# Patient Record
Sex: Male | Born: 1990 | Race: White | Hispanic: No | Marital: Married | Smoking: Never smoker
Health system: Southern US, Community
[De-identification: ages and names within clinical notes are randomized; demographics above are authoritative.]

## PROBLEM LIST (undated history)

## (undated) DIAGNOSIS — G4730 Sleep apnea, unspecified: Secondary | ICD-10-CM

## (undated) DIAGNOSIS — B019 Varicella without complication: Secondary | ICD-10-CM

## (undated) HISTORY — DX: Sleep apnea, unspecified: G47.30

## (undated) HISTORY — PX: NO PAST SURGERIES: SHX2092

## (undated) HISTORY — DX: Varicella without complication: B01.9

---
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
## 2010-06-11 LAB — CBC
HCT: 43.1 % (ref 39.0–52.0)
MCH: 29.7 pg (ref 26.0–34.0)
RBC: 4.82 MIL/uL (ref 4.22–5.81)

## 2010-06-11 LAB — DIFFERENTIAL
Basophils Absolute: 0 10*3/uL (ref 0.0–0.1)
Basophils Relative: 0 % (ref 0–1)
Eosinophils Absolute: 0 10*3/uL (ref 0.0–0.7)
Lymphocytes Relative: 8 % — ABNORMAL LOW (ref 12–46)
Monocytes Relative: 8 % (ref 3–12)
Neutro Abs: 13.8 10*3/uL — ABNORMAL HIGH (ref 1.7–7.7)
Neutrophils Relative %: 84 % — ABNORMAL HIGH (ref 43–77)

## 2010-06-11 LAB — POCT INFECTIOUS MONO SCREEN: Mono Screen: NEGATIVE

## 2010-06-11 LAB — POCT RAPID STREP A (OFFICE): Streptococcus, Group A Screen (Direct): NEGATIVE

## 2011-05-31 ENCOUNTER — Emergency Department (HOSPITAL_COMMUNITY)
Admission: EM | Admit: 2011-05-31 | Discharge: 2011-05-31 | Disposition: A | Discharge status: Home / Self Care | Payer: 59 | Source: Home / Self Care | Attending: Family Medicine | Admitting: Family Medicine

## 2011-05-31 ENCOUNTER — Encounter (HOSPITAL_COMMUNITY): Payer: Self-pay | Admitting: *Deleted

## 2011-05-31 DIAGNOSIS — K219 Gastro-esophageal reflux disease without esophagitis: Secondary | ICD-10-CM

## 2011-05-31 MED ORDER — GI COCKTAIL ~~LOC~~
30.0000 mL | Freq: Once | ORAL | Status: AC
  Administered 2011-05-31: 30 mL via ORAL

## 2011-05-31 MED ORDER — GI COCKTAIL ~~LOC~~
ORAL | Status: AC
  Filled 2011-05-31: qty 30

## 2011-05-31 MED ORDER — OMEPRAZOLE 20 MG PO CPDR: 20.0000 mg | DELAYED_RELEASE_CAPSULE | Freq: Every day | ORAL | Status: DC

## 2011-05-31 NOTE — Discharge Instructions (Signed)
Use medicine as prescribed and see specialist if further problems. °

## 2011-05-31 NOTE — ED Provider Notes (Signed)
History     CSN: [id number]  Arrival date & time 05/31/11  1243   First MD Initiated Contact with Patient 05/31/11 1359      Chief Complaint  Patient presents with  . Gastrophageal Reflux    (Consider location/radiation/quality/duration/timing/severity/associated sxs/prior treatment) Patient is a 21 y.o. male presenting with abdominal pain.  Abdominal Pain The primary symptoms of the illness include abdominal pain, nausea and diarrhea. The primary symptoms of the illness do not include vomiting. The current episode started more than 2 days ago.  The patient has had a change in bowel habit (diarrhea from too much pepto, according to pt). Additional symptoms associated with the illness include heartburn and back pain. Significant associated medical issues include GERD.    No past medical history on file.  No past surgical history on file.  No family history on file.  History  Substance Use Topics  . Smoking status: Not on file  . Smokeless tobacco: Not on file  . Alcohol Use: Not on file      Review of Systems  Constitutional: Negative.   Gastrointestinal: Positive for heartburn, nausea, abdominal pain and diarrhea. Negative for vomiting and blood in stool.  Musculoskeletal: Positive for back pain.  Skin: Negative.     Allergies  Review of patient's allergies indicates no known allergies.  Home Medications   Current Outpatient Rx  Name Route Sig Dispense Refill  . OMEPRAZOLE 20 MG PO CPDR Oral Take 1 capsule (20 mg total) by mouth daily. 42 capsule 9    BP 146/90  Pulse 110  Temp(Src) 99.2 F (37.3 C) (Oral)  Resp 20  SpO2 100%  Physical Exam  Nursing note and vitals reviewed. Constitutional: He is oriented to person, place, and time. He appears well-developed and well-nourished.  HENT:  Head: Normocephalic.  Neck: Normal range of motion. Neck supple.  Cardiovascular: Normal rate and normal heart sounds.   Pulmonary/Chest: Breath sounds normal.    Abdominal: Soft. Bowel sounds are normal. He exhibits no mass. There is tenderness. There is no rebound and no guarding.  Lymphadenopathy:    He has no cervical adenopathy.  Neurological: He is alert and oriented to person, place, and time.  Skin: Skin is warm and dry.    ED Course  Procedures (including critical care time)  Labs Reviewed - No data to display No results found.   1. GERD (gastroesophageal reflux disease)       MDM          Barkley Bruns, MD 05/31/11 1534

## 2011-05-31 NOTE — ED Notes (Signed)
3rd day of epigastric pain right after eating.

## 2011-06-01 ENCOUNTER — Ambulatory Visit: Payer: Self-pay | Admitting: General Practice

## 2011-06-01 LAB — COMPREHENSIVE METABOLIC PANEL
Anion Gap: 14 (ref 7–16)
BUN: 14 mg/dL (ref 7–18)
Calcium, Total: 9.1 mg/dL (ref 8.5–10.1)
Chloride: 100 mmol/L (ref 98–107)
EGFR (African American): >60
Osmolality: 277 (ref 275–301)
Potassium: 3.9 mmol/L (ref 3.5–5.1)
SGOT(AST): 21 U/L (ref 15–37)
Total Protein: 8.3 g/dL — ABNORMAL HIGH (ref 6.4–8.2)

## 2011-06-01 LAB — CBC WITH DIFFERENTIAL/PLATELET
Basophil #: 0 10*3/uL (ref 0.0–0.1)
Basophil %: 0.4 %
Eosinophil #: 0 10*3/uL (ref 0.0–0.7)
HCT: 45 % (ref 40.0–52.0)
HGB: 15.4 g/dL (ref 13.0–18.0)
Lymphocyte #: 1.3 10*3/uL (ref 1.0–3.6)
MCHC: 34.3 g/dL (ref 32.0–36.0)
MCV: 88 fL (ref 80–100)
Neutrophil #: 5.4 10*3/uL (ref 1.4–6.5)
RDW: 12.9 % (ref 11.5–14.5)

## 2012-08-11 IMAGING — US ABDOMEN ULTRASOUND
1 series · 17 of 25 positions shown · non-contrast
Comparison: none

REASON FOR EXAM: STAT CR before 5p [phone] after 5pm [id number] [address]
[place] Cell Phone R...
COMMENTS:

[Series 1: abdomen ultrasound · 17 of 78 slices shown]
[im 1/78]
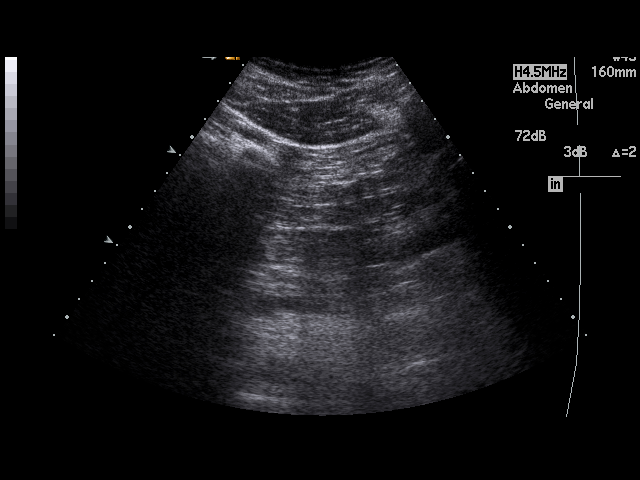
[im 7/78]
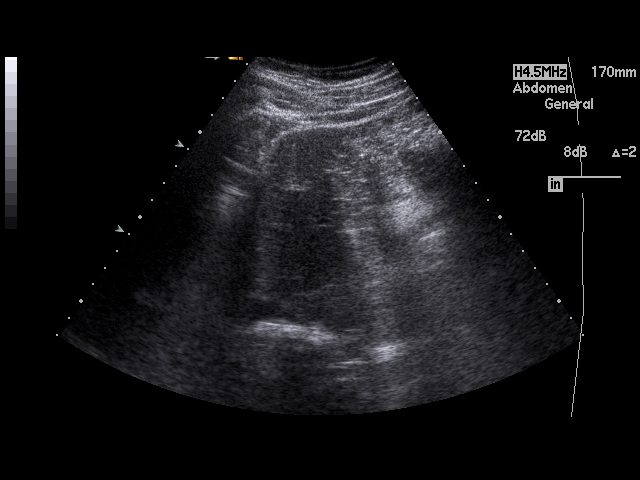
[im 10/78]
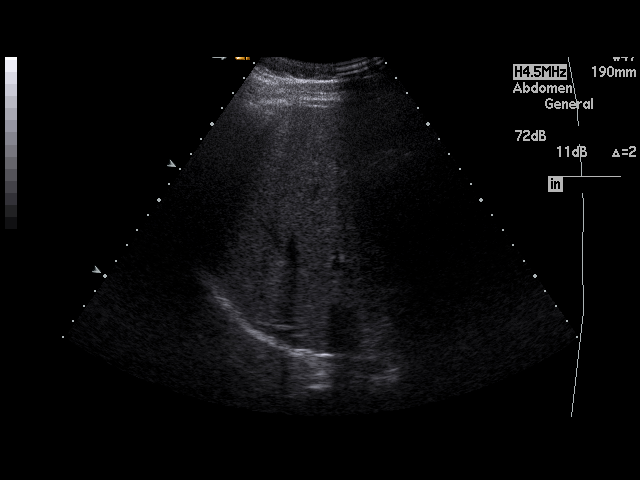
[im 17/78]
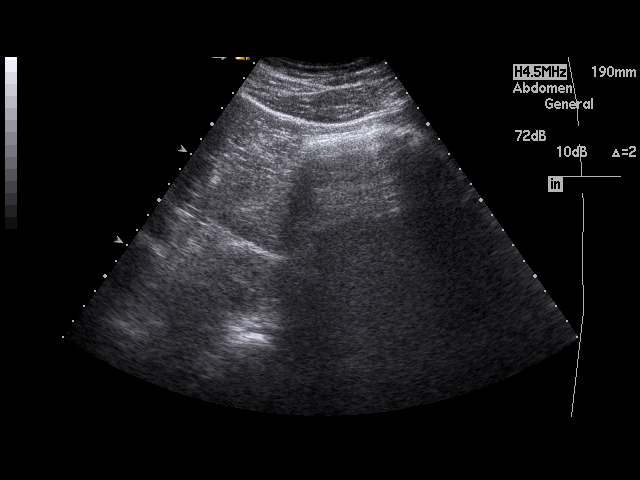
[im 20/78]
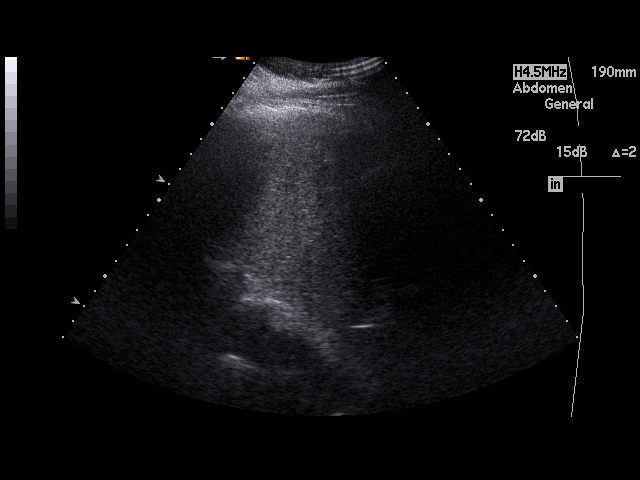
[im 26/78]
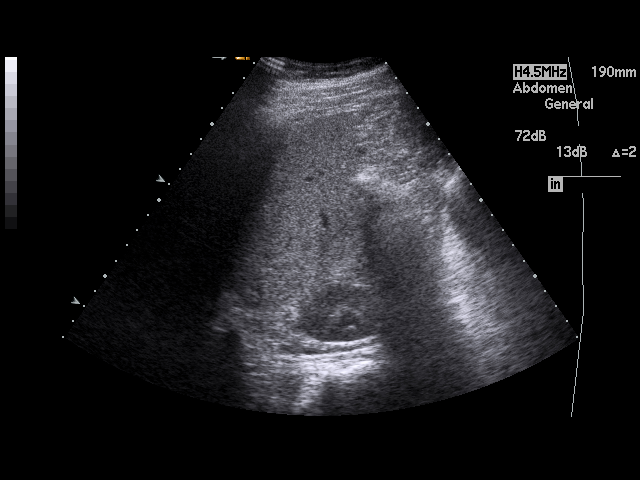
[im 29/78]
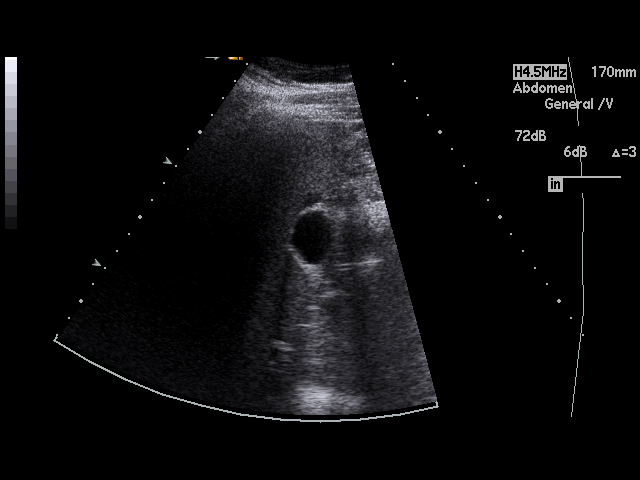
[im 36/78]
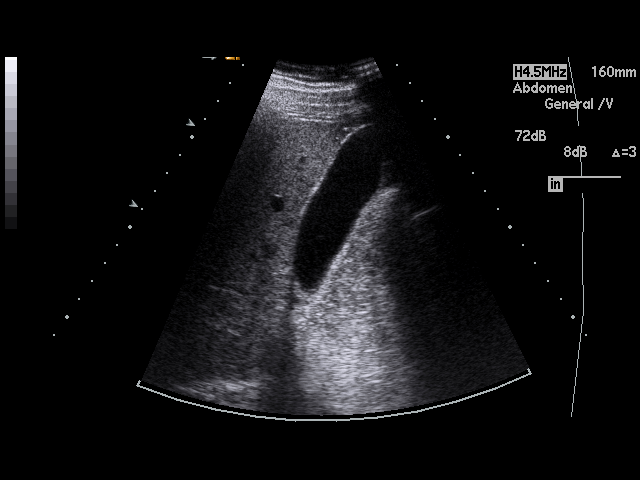
[im 39/78]
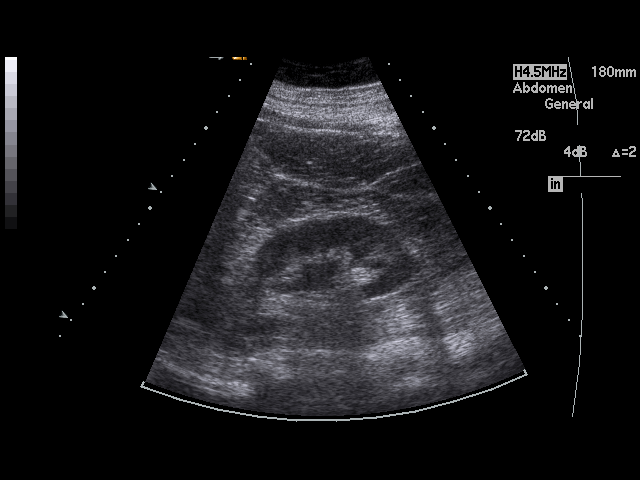
[im 42/78]
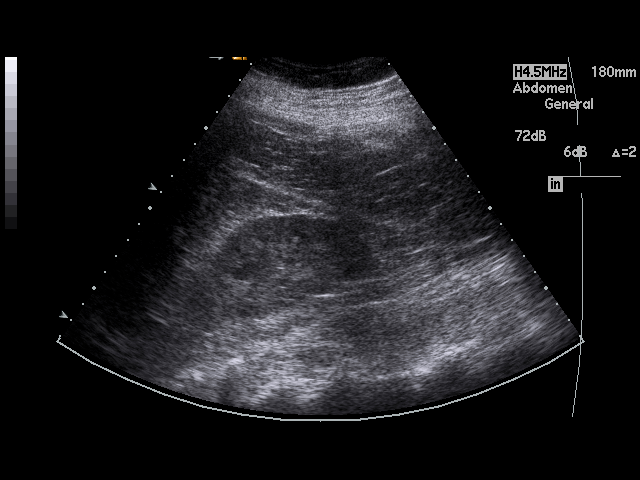
[im 49/78]
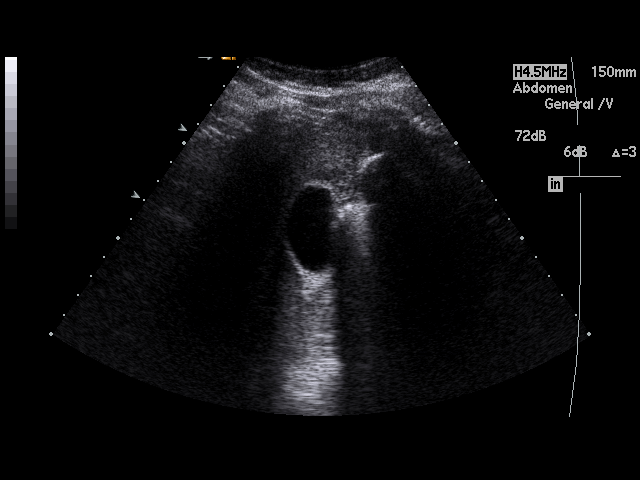
[im 52/78]
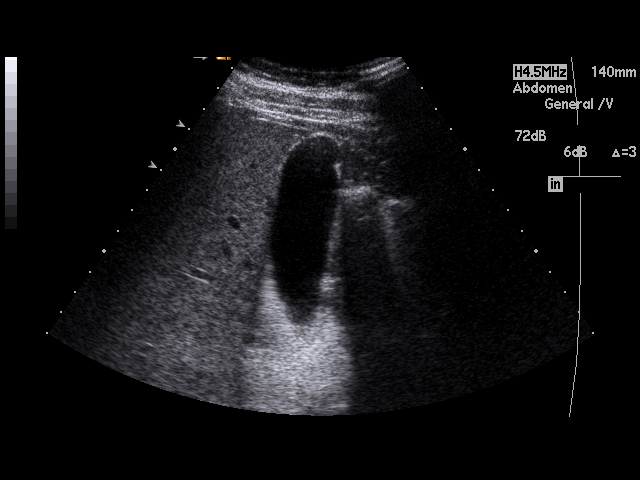
[im 58/78]
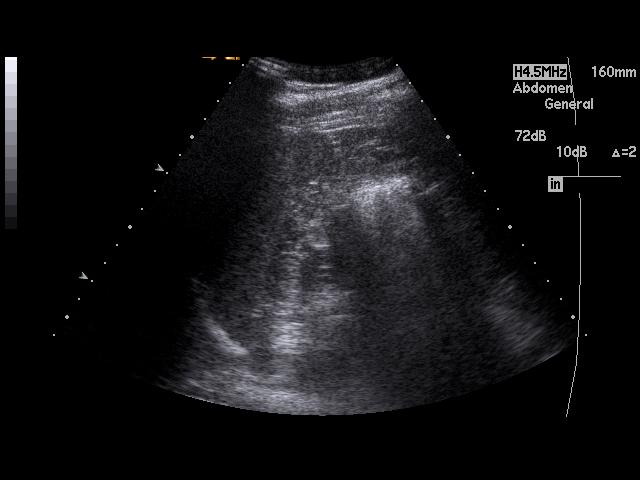
[im 61/78]
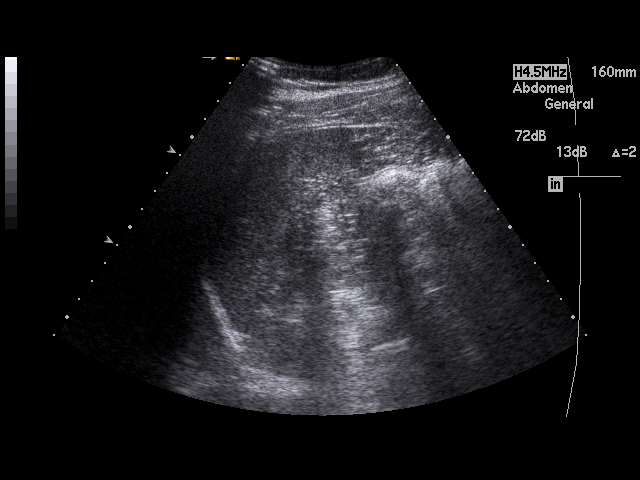
[im 68/78]
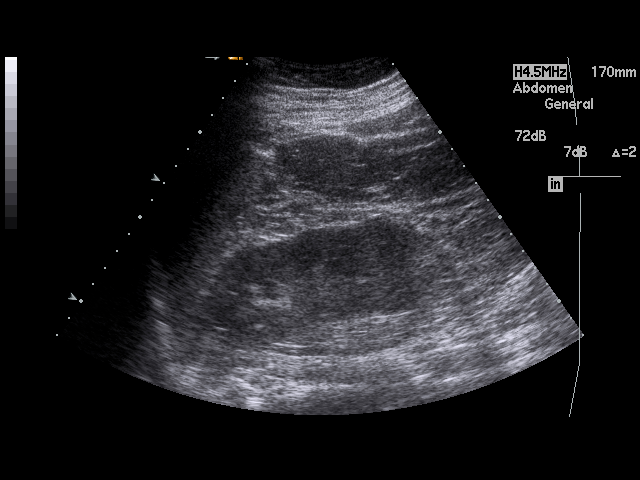
[im 71/78]
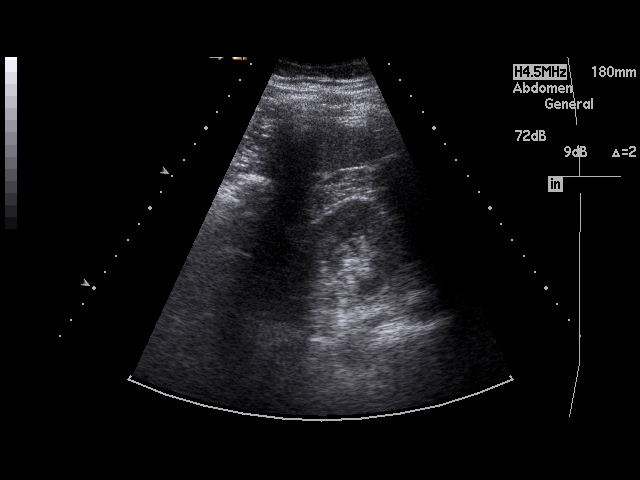
[im 78/78]
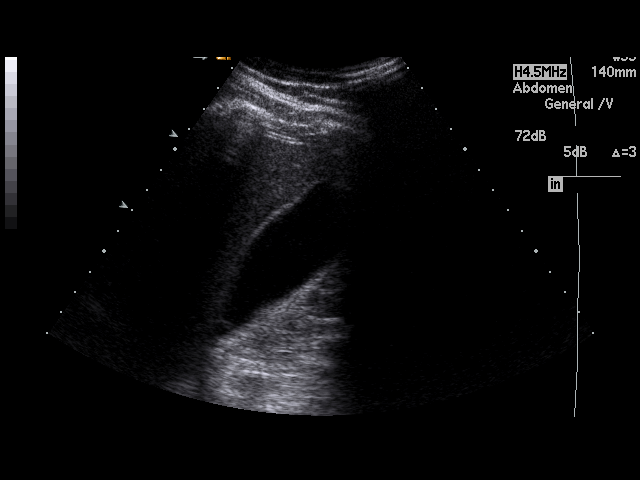

[17 of 25 positions shown; findings below may reference images not displayed]

PROCEDURE:     US  - US ABDOMEN GENERAL SURVEY  - June 01, 2011  [DATE]

RESULT:     Sonographic evaluation of the abdomen shows limited
visualization of the pancreas with no focal acute abnormality. Partial
visualization of the aorta shows no evidence of aneurysm. The gallbladder is
normal without stones or sonographic Murphy's sign. The wall thickness is
2.4 mm. Common bile duct diameter is 1.1 mm. The inferior vena cava, spleen
and kidneys appear normal. The liver shows increased echogenicity diffusely
without focal mass. No intrahepatic biliary ductal dilation is evident. The
portal venous flow is normal.
IMPRESSION: 1. Nonvisualization of portions of the pancreas and aorta. Normal-appearing
abdominal sonogram otherwise.

## 2012-08-15 ENCOUNTER — Ambulatory Visit: Payer: Self-pay | Admitting: Physician Assistant

## 2013-11-10 ENCOUNTER — Ambulatory Visit (INDEPENDENT_AMBULATORY_CARE_PROVIDER_SITE_OTHER): Payer: BLUE CROSS/BLUE SHIELD | Admitting: Family Medicine

## 2013-11-10 VITALS — BP 134/76 | HR 105 | Temp 98.6°F | Resp 15 | Ht 69.0 in | Wt 250.6 lb

## 2013-11-10 DIAGNOSIS — J029 Acute pharyngitis, unspecified: Secondary | ICD-10-CM

## 2013-11-10 LAB — POCT RAPID STREP A (OFFICE): RAPID STREP A SCREEN: NEGATIVE

## 2013-11-10 MED ORDER — HYDROCODONE-ACETAMINOPHEN 7.5-325 MG/15ML PO SOLN: 10.0000 mL | Freq: Four times a day (QID) | ORAL | Status: DC | PRN

## 2013-11-10 MED ORDER — MAGIC MOUTHWASH W/LIDOCAINE: 10.0000 mL | ORAL | Status: DC | PRN

## 2013-11-10 NOTE — Patient Instructions (Signed)

## 2013-11-10 NOTE — Progress Notes (Signed)
Subjective:    Patient ID: Clarence Simmons, male    DOB: 06/28/1990, 23 y.o.   MRNAzzie Simmons: [id number]  Sore Throat  Associated symptoms include coughing and headaches. Pertinent negatives include no ear pain.  Fever  Associated symptoms include coughing, headaches, nausea and a sore throat. Pertinent negatives include no ear pain.   Chief Complaint  Patient presents with  . Sore Throat    since last night  . Fever   This chart was scribed for Norberto SorensonEva Hendy Brindle, MD by Andrew Auaven Small, ED Scribe. This patient was seen in room 3 and the patient's care was started at 10:51 AM.  HPI Comments: Clarence RoupMichael L Simmons is a 23 y.o. male who presents to the [facility] complaining of sudden sore throat and fever onset 1 day. Pt reports associated nausea, cough and HA. Pt reports he was unable to sleep last night due to symptoms. Pt has tried tylenol multi symptom cold with relief and mucinex. Pt reports sick contact of his wife who is also being seen. He reports wife did not have strep. Pt denies hx of mono. Pt denies allergies.   History reviewed. No pertinent past medical history. History reviewed. No pertinent past surgical history. Prior to Admission medications   Medication Sig Start Date End Date Taking? Authorizing Provider  guaiFENesin (MUCINEX) 600 MG 12 hr tablet Take by mouth 2 (two) times daily.   Yes Historical Provider, MD  Phenyleph-CPM-DM-APAP (TYLENOL COLD MULTI-SYMPTOM PO) Take by mouth.   Yes Historical Provider, MD  omeprazole (PRILOSEC) 20 MG capsule Take 1 capsule (20 mg total) by mouth daily. 05/31/11 05/30/12  Linna HoffJames D Kindl, MD   Review of Systems  Constitutional: Positive for fever and chills.  HENT: Positive for sore throat. Negative for ear pain and rhinorrhea.   Respiratory: Positive for cough.   Gastrointestinal: Positive for nausea.  Neurological: Positive for headaches.   Objective:   Physical Exam  Nursing note and vitals reviewed. Constitutional: He is  oriented to person, place, and time. He appears well-developed and well-nourished. No distress.  HENT:  Head: Normocephalic and atraumatic.  Right Ear: Hearing, tympanic membrane, external ear and ear canal normal.  Left Ear: Hearing, tympanic membrane, external ear and ear canal normal.  Nose: Mucosal edema and rhinorrhea present.  Mouth/Throat: Posterior oropharyngeal edema and posterior oropharyngeal erythema present.  Mucosal erythema. Tonsil 3+  Eyes: Conjunctivae and EOM are normal.  Neck: Neck supple.  Cardiovascular: Regular rhythm, S1 normal, S2 normal and normal heart sounds.  Tachycardia present.   Pulmonary/Chest: Effort normal and breath sounds normal. No respiratory distress. He has no wheezes. He has no rales. He exhibits no tenderness.  Musculoskeletal: Normal range of motion.  Lymphadenopathy:       Head (right side): Tonsillar adenopathy present.       Head (left side): Tonsillar adenopathy present.    He has no cervical adenopathy.       Right cervical: No superficial cervical and no posterior cervical adenopathy present.      Left cervical: No superficial cervical and no posterior cervical adenopathy present.       Right: No supraclavicular adenopathy present.       Left: No supraclavicular adenopathy present.  Neurological: He is alert and oriented to person, place, and time.  Skin: Skin is warm and dry.  Psychiatric: He has a normal mood and affect. His behavior is normal.   Filed Vitals:   11/10/13 0944  BP: 134/76  Pulse: 105  Temp: 98.6 F (37 C)  Resp: 15   Results for orders placed in visit on 11/10/13  POCT RAPID STREP A (OFFICE)      Result Value Ref Range   Rapid Strep A Screen Negative  Negative   Assessment & Plan:   Acute pharyngitis, unspecified pharyngitis type - Plan: POCT rapid strep A, Throat culture Loney Loh)  Meds ordered this encounter  Medications  . Phenyleph-CPM-DM-APAP (TYLENOL COLD MULTI-SYMPTOM PO)    Sig: Take by mouth.  Marland Kitchen  guaiFENesin (MUCINEX) 600 MG 12 hr tablet    Sig: Take by mouth 2 (two) times daily.  Marland Kitchen HYDROcodone-acetaminophen (HYCET) 7.5-325 mg/15 ml solution    Sig: Take 10-15 mLs by mouth every 6 (six) hours as needed.    Dispense:  140 mL    Refill:  0  . Alum & Mag Hydroxide-Simeth (MAGIC MOUTHWASH W/LIDOCAINE) SOLN    Sig: Take 10 mLs by mouth every 2 (two) hours as needed for mouth pain.    Dispense:  360 mL    Refill:  0    Ok to use pharmacy formulary. Mix 1:1 ratio with viscous lidocaine    I personally performed the services described in this documentation, which was scribed in my presence. The recorded information has been reviewed and considered, and addended by me as needed.  Norberto Sorenson, MD MPH

## 2013-11-12 LAB — CULTURE, GROUP A STREP: ORGANISM ID, BACTERIA: NORMAL

## 2015-05-06 ENCOUNTER — Ambulatory Visit (INDEPENDENT_AMBULATORY_CARE_PROVIDER_SITE_OTHER): Payer: Commercial Indemnity | Admitting: Family Medicine

## 2015-05-06 ENCOUNTER — Encounter: Payer: Self-pay | Admitting: Family Medicine

## 2015-05-06 VITALS — BP 116/78 | HR 91 | Temp 98.5°F | Ht 70.0 in | Wt 233.1 lb

## 2015-05-06 DIAGNOSIS — Z0000 Encounter for general adult medical examination without abnormal findings: Secondary | ICD-10-CM | POA: Diagnosis not present

## 2015-05-06 DIAGNOSIS — G4733 Obstructive sleep apnea (adult) (pediatric): Secondary | ICD-10-CM | POA: Insufficient documentation

## 2015-05-06 DIAGNOSIS — E669 Obesity, unspecified: Secondary | ICD-10-CM | POA: Insufficient documentation

## 2015-05-06 HISTORY — DX: Obesity, unspecified: E66.9

## 2015-05-06 HISTORY — DX: Encounter for general adult medical examination without abnormal findings: Z00.00

## 2015-05-06 HISTORY — DX: Obstructive sleep apnea (adult) (pediatric): G47.33

## 2015-05-06 NOTE — Assessment & Plan Note (Signed)
Has lost 25 lbs in the last year. Continuing to exercise regularly and is planning to get down to around 200 lbs.

## 2015-05-06 NOTE — Progress Notes (Signed)
Subjective:  Patient ID: Clarence Simmons, male    DOB: 1990-06-04  Age: 25 y.o. MRN: [id number]  CC: Establish care  HPI Clarence Simmons is a 25 y.o. male presents to the clinic today to establish care.  No current issues/complaints.  Preventative Healthcare  Immunizations  Tetanus - Up to date; was given last year.  Pneumococcal - Not indicated.   Flu - 01/10/15.  Labs: Discuss screening labs today. Patient declines.  Exercise: Yes.   Alcohol use: Occasional.   Smoking/tobacco use: No.   STD/HIV testing: Patient married and monogamous. Not indicated.  Regular dental exams: No.   PMH, Surgical Hx, Family Hx, Social History reviewed and updated as below.  Past Medical History  Diagnosis Date  . Chicken pox   . Sleep apnea    Past Surgical History  Procedure Laterality Date  . No past surgeries     Family History  Problem Relation Age of Onset  . Alcohol abuse Mother   . Skin cancer Maternal Grandmother    Social History  Substance Use Topics  . Smoking status: Never Smoker   . Smokeless tobacco: Never Used  . Alcohol Use: 0.0 - 0.6 oz/week    0-1 Standard drinks or equivalent per week   Review of Systems  Neurological: Positive for headaches.  All other systems reviewed and are negative.  Objective:   Today's Vitals: BP 116/78 mmHg  Pulse 91  Temp(Src) 98.5 F (36.9 C) (Oral)  Ht  (1.778 m)  Wt 233 lb 2 oz (105.745 kg)  BMI 33.45 kg/m2  SpO2 96%  Physical Exam  Constitutional: He is oriented to person, place, and time. He appears well-developed and well-nourished. No distress.  HENT:  Head: Normocephalic and atraumatic.  Nose: Nose normal.  Normal TM's bilaterally.  Enlarged tonsils noted.   Eyes: Conjunctivae are normal. No scleral icterus.  Neck: Neck supple. No thyromegaly present.  Cardiovascular: Normal rate and regular rhythm.   No murmur heard. Pulmonary/Chest: Effort normal and breath sounds normal. He has no wheezes.  He has no rales.  Abdominal: Soft. He exhibits no distension. There is no tenderness. There is no rebound and no guarding.  Musculoskeletal: Normal range of motion. He exhibits no edema.  Lymphadenopathy:    He has no cervical adenopathy.  Neurological: He is alert and oriented to person, place, and time.  Skin: Skin is warm and dry. No rash noted.  Psychiatric: He has a normal mood and affect.  Vitals reviewed.  Assessment & Plan:   Problem List Items Addressed This Visit    Obesity (BMI 30.0-34.9)    Has lost 25 lbs in the last year. Continuing to exercise regularly and is planning to get down to around 200 lbs.      OSA (obstructive sleep apnea)    Doing well on CPAP.      Preventative health care - Primary    Tdap and flu up-to-date. Declined screening lab work.         Outpatient Encounter Prescriptions as of 05/06/2015  Medication Sig  . [DISCONTINUED] Alum & Mag Hydroxide-Simeth (MAGIC MOUTHWASH W/LIDOCAINE) SOLN Take 10 mLs by mouth every 2 (two) hours as needed for mouth pain.  . [DISCONTINUED] guaiFENesin (MUCINEX) 600 MG 12 hr tablet Take by mouth 2 (two) times daily.  . [DISCONTINUED] HYDROcodone-acetaminophen (HYCET) 7.5-325 mg/15 ml solution Take 10-15 mLs by mouth every 6 (six) hours as needed.  . [DISCONTINUED] omeprazole (PRILOSEC) 20 MG capsule Take 1 capsule (20  mg total) by mouth daily.  . [DISCONTINUED] Phenyleph-CPM-DM-APAP (TYLENOL COLD MULTI-SYMPTOM PO) Take by mouth.   No facility-administered encounter medications on file as of 05/06/2015.    Follow-up: Return in about 1 year (around 05/05/2016).  Everlene Other DO [facility]

## 2015-05-06 NOTE — Assessment & Plan Note (Signed)
Doing well on CPAP 

## 2015-05-06 NOTE — Progress Notes (Signed)
Pre visit review using our clinic review tool, if applicable. No additional management support is needed unless otherwise documented below in the visit note. 

## 2015-05-06 NOTE — Assessment & Plan Note (Signed)
Tdap and flu up-to-date. Declined screening lab work.

## 2015-05-06 NOTE — Patient Instructions (Signed)
It was nice to see you today.  Continue using your CPAP.  Follow up:  Annually or sooner if needed.  Take care  Dr. Adriana Simas  Health Maintenance, Male A healthy lifestyle and preventative care can promote health and wellness.  Maintain regular health, dental, and eye exams.  Eat a healthy diet. Foods like vegetables, fruits, whole grains, low-fat dairy products, and lean protein foods contain the nutrients you need and are low in calories. Decrease your intake of foods high in solid fats, added sugars, and salt. Get information about a proper diet from your health care provider, if necessary.  Regular physical exercise is one of the most important things you can do for your health. Most adults should get at least 150 minutes of moderate-intensity exercise (any activity that increases your heart rate and causes you to sweat) each week. In addition, most adults need muscle-strengthening exercises on 2 or more days a week.   Maintain a healthy weight. The body mass index (BMI) is a screening tool to identify possible weight problems. It provides an estimate of body fat based on height and weight. Your health care provider can find your BMI and can help you achieve or maintain a healthy weight. For males 20 years and older:  A BMI below 18.5 is considered underweight.  A BMI of 18.5 to 24.9 is normal.  A BMI of 25 to 29.9 is considered overweight.  A BMI of 30 and above is considered obese.  Maintain normal blood lipids and cholesterol by exercising and minimizing your intake of saturated fat. Eat a balanced diet with plenty of fruits and vegetables. Blood tests for lipids and cholesterol should begin at age 59 and be repeated every 5 years. If your lipid or cholesterol levels are high, you are over age 60, or you are at high risk for heart disease, you may need your cholesterol levels checked more frequently.Ongoing high lipid and cholesterol levels should be treated with medicines if diet  and exercise are not working.  If you smoke, find out from your health care provider how to quit. If you do not use tobacco, do not start.  Lung cancer screening is recommended for adults aged 55-80 years who are at high risk for developing lung cancer because of a history of smoking. A yearly low-dose CT scan of the lungs is recommended for people who have at least a 30-pack-year history of smoking and are current smokers or have quit within the past 15 years. A pack year of smoking is smoking an average of 1 pack of cigarettes a day for 1 year (for example, a 30-pack-year history of smoking could mean smoking 1 pack a day for 30 years or 2 packs a day for 15 years). Yearly screening should continue until the smoker has stopped smoking for at least 15 years. Yearly screening should be stopped for people who develop a health problem that would prevent them from having lung cancer treatment.  If you choose to drink alcohol, do not have more than 2 drinks per day. One drink is considered to be 12 oz (360 mL) of beer, 5 oz (150 mL) of wine, or 1.5 oz (45 mL) of liquor.  Avoid the use of street drugs. Do not share needles with anyone. Ask for help if you need support or instructions about stopping the use of drugs.  High blood pressure causes heart disease and increases the risk of stroke. High blood pressure is more likely to develop in:  People  who have blood pressure in the end of the normal range (100-139/85-89 mm Hg).  People who are overweight or obese.  People who are African American.  If you are 23-27 years of age, have your blood pressure checked every 3-5 years. If you are 82 years of age or older, have your blood pressure checked every year. You should have your blood pressure measured twice--once when you are at a hospital or clinic, and once when you are not at a hospital or clinic. Record the average of the two measurements. To check your blood pressure when you are not at a hospital or  clinic, you can use:  An automated blood pressure machine at a pharmacy.  A home blood pressure monitor.  If you are 70-45 years old, ask your health care provider if you should take aspirin to prevent heart disease.  Diabetes screening involves taking a blood sample to check your fasting blood sugar level. This should be done once every 3 years after age 84 if you are at a normal weight and without risk factors for diabetes. Testing should be considered at a younger age or be carried out more frequently if you are overweight and have at least 1 risk factor for diabetes.  Colorectal cancer can be detected and often prevented. Most routine colorectal cancer screening begins at the age of 71 and continues through age 11. However, your health care provider may recommend screening at an earlier age if you have risk factors for colon cancer. On a yearly basis, your health care provider may provide home test kits to check for hidden blood in the stool. A small camera at the end of a tube may be used to directly examine the colon (sigmoidoscopy or colonoscopy) to detect the earliest forms of colorectal cancer. Talk to your health care provider about this at age 24 when routine screening begins. A direct exam of the colon should be repeated every 5-10 years through age 56, unless early forms of precancerous polyps or small growths are found.  People who are at an increased risk for hepatitis B should be screened for this virus. You are considered at high risk for hepatitis B if:  You were born in a country where hepatitis B occurs often. Talk with your health care provider about which countries are considered high risk.  Your parents were born in a high-risk country and you have not received a shot to protect against hepatitis B (hepatitis B vaccine).  You have HIV or AIDS.  You use needles to inject street drugs.  You live with, or have sex with, someone who has hepatitis B.  You are a man who has  sex with other men (MSM).  You get hemodialysis treatment.  You take certain medicines for conditions like cancer, organ transplantation, and autoimmune conditions.  Hepatitis C blood testing is recommended for all people born from [birth year] through 1965 and any individual with known risk factors for hepatitis C.  Healthy men should no longer receive prostate-specific antigen (PSA) blood tests as part of routine cancer screening. Talk to your health care provider about prostate cancer screening.  Testicular cancer screening is not recommended for adolescents or adult males who have no symptoms. Screening includes self-exam, a health care provider exam, and other screening tests. Consult with your health care provider about any symptoms you have or any concerns you have about testicular cancer.  Practice safe sex. Use condoms and avoid high-risk sexual practices to reduce the spread of sexually  transmitted infections (STIs).  You should be screened for STIs, including gonorrhea and chlamydia if:  You are sexually active and are younger than 24 years.  You are older than 24 years, and your health care provider tells you that you are at risk for this type of infection.  Your sexual activity has changed since you were last screened, and you are at an increased risk for chlamydia or gonorrhea. Ask your health care provider if you are at risk.  If you are at risk of being infected with HIV, it is recommended that you take a prescription medicine daily to prevent HIV infection. This is called pre-exposure prophylaxis (PrEP). You are considered at risk if:  You are a man who has sex with other men (MSM).  You are a heterosexual man who is sexually active with multiple partners.  You take drugs by injection.  You are sexually active with a partner who has HIV.  Talk with your health care provider about whether you are at high risk of being infected with HIV. If you choose to begin PrEP, you should  first be tested for HIV. You should then be tested every 3 months for as long as you are taking PrEP.  Use sunscreen. Apply sunscreen liberally and repeatedly throughout the day. You should seek shade when your shadow is shorter than you. Protect yourself by wearing long sleeves, pants, a wide-brimmed hat, and sunglasses year round whenever you are outdoors.  Tell your health care provider of new moles or changes in moles, especially if there is a change in shape or color. Also, tell your health care provider if a mole is larger than the size of a pencil eraser.  A one-time screening for abdominal aortic aneurysm (AAA) and surgical repair of large AAAs by ultrasound is recommended for men aged 92-75 years who are current or former smokers.  Stay current with your vaccines (immunizations).   This information is not intended to replace advice given to you by your health care provider. Make sure you discuss any questions you have with your health care provider.   Document Released: 09/10/2007 Document Revised: 04/04/2014 Document Reviewed: 08/09/2010 Elsevier Interactive Patient Education [employer].

## 2015-07-21 ENCOUNTER — Telehealth: Payer: Self-pay | Admitting: Family Medicine

## 2015-07-21 NOTE — Telephone Encounter (Signed)
Pt dropped of paper work to be filled out by Dr. Adriana Simasook.

## 2015-11-11 ENCOUNTER — Encounter: Payer: Self-pay | Admitting: Family Medicine

## 2015-11-11 ENCOUNTER — Ambulatory Visit (INDEPENDENT_AMBULATORY_CARE_PROVIDER_SITE_OTHER): Payer: Managed Care, Other (non HMO) | Admitting: Family Medicine

## 2015-11-11 DIAGNOSIS — R21 Rash and other nonspecific skin eruption: Secondary | ICD-10-CM | POA: Diagnosis not present

## 2015-11-11 DIAGNOSIS — R1900 Intra-abdominal and pelvic swelling, mass and lump, unspecified site: Secondary | ICD-10-CM

## 2015-11-11 DIAGNOSIS — B351 Tinea unguium: Secondary | ICD-10-CM | POA: Insufficient documentation

## 2015-11-11 MED ORDER — TRIAMCINOLONE ACETONIDE 0.1 % EX OINT: 1.0000 "application " | TOPICAL_OINTMENT | Freq: Two times a day (BID) | CUTANEOUS | 0 refills | Status: DC

## 2015-11-11 MED ORDER — CICLOPIROX 8 % EX SOLN: Freq: Every day | CUTANEOUS | 0 refills | Status: DC

## 2015-11-11 NOTE — Progress Notes (Signed)
Pre visit review using our clinic review tool, if applicable. No additional management support is needed unless otherwise documented below in the visit note. 

## 2015-11-11 NOTE — Assessment & Plan Note (Signed)
New problem. Treating with Ciclopirox.

## 2015-11-11 NOTE — Assessment & Plan Note (Signed)
New problem. No palpable mass. Appears to be adipose tissue/distribution of fat.

## 2015-11-11 NOTE — Patient Instructions (Signed)
Use the medications as directed.  Follow up annually or sooner if needed.   Take care  Dr. Adriana Simasook

## 2015-11-11 NOTE — Assessment & Plan Note (Signed)
New problem. Unclear etiology. Treating with topical triamcinolone.

## 2015-11-11 NOTE — Progress Notes (Signed)
Subjective:  Patient ID: Clarence Simmons, male    DOB: 04/10/1990  Age: 25 y.o. MRN: [id number]  CC: Bumps on hands, Raised area on abdomen, Toenail fungus.  HPI:  25 year old male presents with the above complaints.  Bumps on hands  Patient states that he is recently noticed some small raised bumps on the dorsum of his hands.  No associated pain, discomfort, or itching.  This is more of an anesthetic issue.  No known inciting factor. No known exacerbating or relieving factors.  No medications or interventions tried.  No new exposures.  Raised area/lump on abdomen  Patient states that he has recently been losing weight intentionally.  He states that he's noticed a prominence/raised area on the right side of his upper abdomen.  This too is an anesthetic issue. No palpable nodule or mass.  No reports of hernia.  No associated pain/discomfort.  No known exacerbating or relieving factors.  Toenail fungus  Patient reports thick discolored toenails (right foot).  This is been going on for months.  No medications or interventions tried.  No known exacerbating or relieving factors.  No other complaints at this time.  Social Hx   Social History   Social History  . Marital status: Single    Spouse name: N/A  . Number of children: N/A  . Years of education: N/A   Social History Main Topics  . Smoking status: Never Smoker  . Smokeless tobacco: Never Used  . Alcohol use 0.0 - 0.6 oz/week  . Drug use: No  . Sexual activity: Not Asked   Other Topics Concern  . None   Social History Narrative  . None   Review of Systems  Constitutional: Negative.   Gastrointestinal:       "Lump" abdomen.  Skin: Positive for rash.       Toenail discoloration/increased thickness.   Objective:  BP (!) 146/81 (BP Location: Right Arm, Patient Position: Sitting, Cuff Size: Normal)   Pulse 81   Temp 98.6 F (37 C) (Oral)   Wt 223 lb (101.2 kg)   SpO2 98%   BMI 32.00  kg/m   BP/Weight 11/11/2015 05/06/2015 11/10/2013  Systolic BP 146 116 134  Diastolic BP 81 78 76  Wt. (Lbs) 223 233.13 250.6  BMI 32 33.45 36.99   Physical Exam  Constitutional: He is oriented to person, place, and time. He appears well-developed. No distress.  Pulmonary/Chest: Effort normal.  Abdominal: Soft. He exhibits no distension and no mass. There is no tenderness. There is no rebound and no guarding.  Area of concern appears to be adipose tissue.  Neurological: He is alert and oriented to person, place, and time.  Skin:  Small papules noted on the dorsum of both hands.  Right foot - onychomycosis noted.  Psychiatric: He has a normal mood and affect.  Vitals reviewed.  Lab Results  Component Value Date   WBC 7.4 06/01/2011   HGB 15.4 06/01/2011   HCT 45.0 06/01/2011   PLT 263 06/01/2011   GLUCOSE 85 06/01/2011   ALT 36 06/01/2011   AST 21 06/01/2011   NA 139 06/01/2011   K 3.9 06/01/2011   CL 100 06/01/2011   CREATININE 1.19 06/01/2011   BUN 14 06/01/2011   CO2 25 06/01/2011    Assessment & Plan:   Problem List Items Addressed This Visit    Lump in the abdomen    New problem. No palpable mass. Appears to be adipose tissue/distribution of fat.  Onychomycosis    New problem. Treating with Ciclopirox.       Relevant Medications   ciclopirox (PENLAC) 8 % solution   Rash    New problem. Unclear etiology. Treating with topical triamcinolone.       Other Visit Diagnoses   None.     Meds ordered this encounter  Medications  . ciclopirox (PENLAC) 8 % solution    Sig: Apply topically at bedtime. Apply over nail and surrounding skin. Apply daily over previous coat. After seven (7) days, may remove with alcohol and continue cycle.    Dispense:  6 mL    Refill:  0  . triamcinolone ointment (KENALOG) 0.1 %    Sig: Apply 1 application topically 2 (two) times daily.    Dispense:  30 g    Refill:  0    Follow-up: PRN  Everlene OtherJayce Harace Mccluney DO [facility] [place]

## 2016-05-06 ENCOUNTER — Encounter: Payer: Commercial Indemnity | Admitting: Family Medicine

## 2016-07-06 ENCOUNTER — Telehealth: Payer: Self-pay | Admitting: Family Medicine

## 2016-07-06 NOTE — Telephone Encounter (Signed)
Pt called stating in order for him to get his Cpap machine he needs a Rx. Does pt need to come in for a appt?  Pharmacy is [facility].   Call pt @ [phone]. Thank you!

## 2016-07-06 NOTE — Telephone Encounter (Signed)
Will you write rx or do you want to see him in office.

## 2016-07-07 NOTE — Telephone Encounter (Signed)
Pt was called and told he could pick it up from the office.

## 2016-07-07 NOTE — Telephone Encounter (Signed)
Will write Rx.

## 2016-07-13 NOTE — Telephone Encounter (Signed)
Pt called back and stated that all information was okay'd by insurance. Pt was told information would be sent over to [facility].

## 2016-07-13 NOTE — Telephone Encounter (Signed)
Spoke with pt and told him that he will need to check with insurance due to sleep study being three years old. He was also informed he will need to schedule and appt with Dr.Cook to talk about OSA due to last OSA visit was 1 year ago. Pt agreed and would call insurance before we moved forward just to be on safe side to make sure they will cover.

## 2016-07-13 NOTE — Telephone Encounter (Signed)
Patient stated that this would have to be faxed over to [facility], along with the sleep study, Insurance, Rx, and the note from the doctor.

## 2016-07-13 NOTE — Telephone Encounter (Signed)
Information was faxed.

## 2016-08-01 ENCOUNTER — Encounter: Payer: Self-pay | Admitting: Family Medicine

## 2016-08-01 ENCOUNTER — Ambulatory Visit (INDEPENDENT_AMBULATORY_CARE_PROVIDER_SITE_OTHER): Payer: Managed Care, Other (non HMO) | Admitting: Family Medicine

## 2016-08-01 VITALS — BP 114/80 | HR 97 | Temp 97.6°F | Wt 236.5 lb

## 2016-08-01 DIAGNOSIS — Z13220 Encounter for screening for lipoid disorders: Secondary | ICD-10-CM

## 2016-08-01 DIAGNOSIS — G4733 Obstructive sleep apnea (adult) (pediatric): Secondary | ICD-10-CM

## 2016-08-01 DIAGNOSIS — Z23 Encounter for immunization: Secondary | ICD-10-CM

## 2016-08-01 DIAGNOSIS — R7989 Other specified abnormal findings of blood chemistry: Secondary | ICD-10-CM | POA: Diagnosis not present

## 2016-08-01 DIAGNOSIS — R5382 Chronic fatigue, unspecified: Secondary | ICD-10-CM

## 2016-08-01 NOTE — Progress Notes (Signed)
Pre visit review using our clinic review tool, if applicable. No additional management support is needed unless otherwise documented below in the visit note. 

## 2016-08-01 NOTE — Patient Instructions (Signed)
We will call with the sleep study and with the lab results.  Follow up to be scheduled after that.  Take care  Dr. Adriana Simasook

## 2016-08-02 DIAGNOSIS — R5382 Chronic fatigue, unspecified: Secondary | ICD-10-CM | POA: Insufficient documentation

## 2016-08-02 HISTORY — DX: Chronic fatigue, unspecified: R53.82

## 2016-08-02 LAB — CBC
HCT: 46.3 % (ref 39.0–52.0)
HEMOGLOBIN: 15.7 g/dL (ref 13.0–17.0)
MCHC: 34 g/dL (ref 30.0–36.0)
MCV: 88.9 fl (ref 78.0–100.0)
PLATELETS: 268 10*3/uL (ref 150.0–400.0)
RBC: 5.21 Mil/uL (ref 4.22–5.81)
RDW: 13.2 % (ref 11.5–15.5)
WBC: 8.5 10*3/uL (ref 4.0–10.5)

## 2016-08-02 LAB — COMPREHENSIVE METABOLIC PANEL
ALBUMIN: 5.1 g/dL (ref 3.5–5.2)
ALK PHOS: 39 U/L (ref 39–117)
ALT: 27 U/L (ref 0–53)
AST: 17 U/L (ref 0–37)
BUN: 13 mg/dL (ref 6–23)
CALCIUM: 10.5 mg/dL (ref 8.4–10.5)
CHLORIDE: 103 meq/L (ref 96–112)
CO2: 31 mEq/L (ref 19–32)
CREATININE: 1.13 mg/dL (ref 0.40–1.50)
GFR: 83.43 mL/min (ref >60.00)
GLUCOSE: 79 mg/dL (ref 70–99)
POTASSIUM: 4 meq/L (ref 3.5–5.1)
Sodium: 141 mEq/L (ref 135–145)
Total Bilirubin: 0.3 mg/dL (ref 0.2–1.2)
Total Protein: 7.7 g/dL (ref 6.0–8.3)

## 2016-08-02 LAB — LIPID PANEL
Cholesterol: 212 mg/dL — ABNORMAL HIGH (ref 0–200)
HDL: 38.1 mg/dL — AB (ref >39.00)
NONHDL: 173.43
Total CHOL/HDL Ratio: 6
Triglycerides: 243 mg/dL — ABNORMAL HIGH (ref 0.0–149.0)
VLDL: 48.6 mg/dL — AB (ref 0.0–40.0)

## 2016-08-02 LAB — TSH: TSH: 2.6 u[IU]/mL (ref 0.35–4.50)

## 2016-08-02 LAB — HEMOGLOBIN A1C: Hgb A1c MFr Bld: 5.7 % (ref 4.6–6.5)

## 2016-08-02 LAB — LDL CHOLESTEROL, DIRECT: LDL DIRECT: 147 mg/dL

## 2016-08-02 NOTE — Progress Notes (Signed)
Subjective:  Patient ID: Clarence Simmons, male    DOB: May 13, 1990  Age: 26 y.o. MRN: [id number]  CC: Fatigue, Desires Tdap  HPI:  26 year old male with obesity and obstructive sleep apnea presents with the above complaints.  Fatigue  Patient reports ongoing fatigue.  He states that this is been going on for quite some time now.  He reports that he is not sleeping well. He is irritable. He's having difficulty with remembering things.  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  He states that this seems to have been worse since he found out his wife is pregnant.  He reports weight gain.  He treats his symptoms to sleep apnea.  He endorses compliance with his CPAP.  He is concerned that he needs an additional titration.  No known relieving factors.  No other associated symptoms.  Need for Tdap  Patient's wife is getting ready to have their first child and he wants to be vaccinated to cover for pertussis.  He will receive this today.  Social Hx   Social History   Social History  . Marital status: Single    Spouse name: N/A  . Number of children: N/A  . Years of education: N/A   Social History Main Topics  . Smoking status: Never Smoker  . Smokeless tobacco: Never Used  . Alcohol use 0.0 - 0.6 oz/week  . Drug use: No  . Sexual activity: Not Asked   Other Topics Concern  . None   Social History Narrative  . None    Review of Systems  Constitutional: Positive for fatigue and unexpected weight change.  Psychiatric/Behavioral: Positive for sleep disturbance.   Objective:  BP 114/80   Pulse 97   Temp 97.6 F (36.4 C) (Oral)   Wt 236 lb 8 oz (107.3 kg)   SpO2 96%   BMI 33.93 kg/m   BP/Weight 08/01/2016 11/11/2015 05/06/2015  Systolic BP 114 146 116  Diastolic BP 80 81 78  Wt. (Lbs) 236.5 223 233.13  BMI 33.93 32 33.45   Physical Exam  Constitutional: He is oriented to person,  place, and time. He appears well-developed. No distress.  Cardiovascular: Normal rate and regular rhythm.   Pulmonary/Chest: Effort normal and breath sounds normal. He has no wheezes. He has no rales.  Neurological: He is alert and oriented to person, place, and time.  Psychiatric:  Flat affect.   Vitals reviewed.   Lab Results  Component Value Date   WBC 7.4 06/01/2011   HGB 15.4 06/01/2011   HCT 45.0 06/01/2011   PLT 263 06/01/2011   GLUCOSE 85 06/01/2011   ALT 36 06/01/2011   AST 21 06/01/2011   NA 139 06/01/2011   K 3.9 06/01/2011   CL 100 06/01/2011   CREATININE 1.19 06/01/2011   BUN 14 06/01/2011   CO2 25 06/01/2011   Depression screen PHQ 2/9 08/01/2016  Decreased Interest 2  Down, Depressed, Hopeless 0  PHQ - 2 Score 2  Altered sleeping 0  Tired, decreased energy 3  Change in appetite 3  Feeling bad or failure about yourself  0  Trouble concentrating 1  Moving slowly or fidgety/restless 0  Suicidal thoughts 0  PHQ-9 Score 9  Difficult doing work/chores Very difficult    Assessment & Plan:   Problem List Items Addressed This Visit    Chronic fatigue - Primary    New problem. Concern etiology/prognosis at this  time. Arranging sleep study. Labs today. Possible component of depression. PHQ9 - 9. He denies feelings of depression.      Relevant Orders   CBC   Hemoglobin A1c   Comprehensive metabolic panel   TSH   OSA (obstructive sleep apnea)   Relevant Orders   Split night study    Other Visit Diagnoses    Need for diphtheria-tetanus-pertussis (Tdap) vaccine       Relevant Orders   Tdap vaccine greater than or equal to 7yo IM (Completed)   Screening, lipid       Relevant Orders   Lipid panel      Follow-up: Pending sleep study and labs  Tasmine Hipwell [facility] DO [facility] [place]

## 2016-08-02 NOTE — Assessment & Plan Note (Signed)
New problem. Concern etiology/prognosis at this time. Arranging sleep study. Labs today. Possible component of depression. PHQ9 - 9. He denies feelings of depression.

## 2017-08-19 ENCOUNTER — Telehealth: Payer: Managed Care, Other (non HMO) | Admitting: Nurse Practitioner

## 2017-08-19 DIAGNOSIS — J329 Chronic sinusitis, unspecified: Secondary | ICD-10-CM

## 2017-08-19 DIAGNOSIS — B9789 Other viral agents as the cause of diseases classified elsewhere: Secondary | ICD-10-CM

## 2017-08-19 MED ORDER — FLUTICASONE PROPIONATE 50 MCG/ACT NA SUSP: 2.0000 | Freq: Every day | NASAL | 6 refills | Status: DC

## 2017-08-19 NOTE — Progress Notes (Signed)

## 2018-05-05 ENCOUNTER — Telehealth: Payer: BLUE CROSS/BLUE SHIELD | Admitting: Nurse Practitioner

## 2018-05-05 DIAGNOSIS — L739 Follicular disorder, unspecified: Secondary | ICD-10-CM | POA: Diagnosis not present

## 2018-05-05 MED ORDER — CEPHALEXIN 500 MG PO CAPS: 500.0000 mg | ORAL_CAPSULE | Freq: Two times a day (BID) | ORAL | 0 refills | Status: DC

## 2018-05-05 NOTE — Progress Notes (Signed)
E Visit for Rash  We are sorry that you are not feeling well. Here is how we plan to help!    Based upon what you have shared with me it looks like you have a bacterial follicultits.  Folliculitis is inflammation of the hair follicles that can be caused by a superficial infection of the skin and is treated with an antibiotic. I have prescribed: and Keflex 500 mg three times per day for 7 days      HOME CARE:   Take cool showers and avoid direct sunlight.  Apply cool compress or wet dressings.  Take a bath in an oatmeal bath.  Sprinkle content of one Aveeno packet under running faucet with comfortably warm water.  Bathe for 15-20 minutes, 1-2 times daily.  Pat dry with a towel. Do not rub the rash.  Use hydrocortisone cream.  Take an antihistamine like Benadryl for widespread rashes that itch.  The adult dose of Benadryl is 25-50 mg by mouth 4 times daily.  Caution:  This type of medication may cause sleepiness.  Do not drink alcohol, drive, or operate dangerous machinery while taking antihistamines.  Do not take these medications if you have prostate enlargement.  Read package instructions thoroughly on all medications that you take.  GET HELP RIGHT AWAY IF:   Symptoms don't go away after treatment.  Severe itching that persists.  If you rash spreads or swells.  If you rash begins to smell.  If it blisters and opens or develops a yellow-brown crust.  You develop a fever.  You have a sore throat.  You become short of breath.  MAKE SURE YOU:  Understand these instructions. Will watch your condition. Will get help right away if you are not doing well or get worse.  Thank you for choosing an e-visit. Your e-visit answers were reviewed by a board certified advanced clinical practitioner to complete your personal care plan. Depending upon the condition, your plan could have included both over the counter or prescription medications. Please review your pharmacy choice.  Be sure that the pharmacy you have chosen is open so that you can pick up your prescription now.  If there is a problem you may message your provider in MyChart to have the prescription routed to another pharmacy. Your safety is important to us. If you have drug allergies check your prescription carefully.  For the next 24 hours, you can use MyChart to ask questions about today's visit, request a non-urgent call back, or ask for a work or school excuse from your e-visit provider. You will get an email in the next two days asking about your experience. I hope that your e-visit has been valuable and will speed your recovery.  5 minutes spent reviewing and documenting in chart.     

## 2018-05-16 ENCOUNTER — Encounter: Payer: Self-pay | Admitting: Family Medicine

## 2018-05-16 ENCOUNTER — Ambulatory Visit: Payer: Managed Care, Other (non HMO) | Admitting: Family Medicine

## 2018-05-16 VITALS — BP 118/80 | HR 72 | Temp 98.2°F | Resp 16 | Ht 71.0 in | Wt 237.0 lb

## 2018-05-16 DIAGNOSIS — L739 Follicular disorder, unspecified: Secondary | ICD-10-CM | POA: Diagnosis not present

## 2018-05-17 NOTE — Progress Notes (Signed)
Subjective:    Patient ID: Clarence Simmons, male    DOB: 08-12-1990, 28 y.o.   MRN: [id number]  HPI   Patient presents to clinic complaining of red bumps in pubic area.  Patient states he does shave the skin down there.  He does not use soap or shaving cream prior to shaving the area with a razor, usually just uses water and shaves this hair.  He does not do any sort of exfoliation after shaving.  States the red bumps have been present now for almost 2 weeks.  He did have a visit and was diagnosed with folliculitis and treated with a course of Keflex.  Patient states the redness has been minimally improved, but not resolved so he became concerned.  Patient states he does not have any concerns for STDs.  He is sexually active with his wife and sometimes they will have another woman in the bedroom, but he still states he has no concerns for any STDs as his wife has no symptoms.  Denies any history of HPV, genital warts, genital herpes or any other STDs.   Patient Active Problem List   Diagnosis Date Noted  . Chronic fatigue 08/02/2016  . OSA (obstructive sleep apnea) 05/06/2015  . Preventative health care 05/06/2015  . Obesity (BMI 30.0-34.9) 05/06/2015   Social History   Tobacco Use  . Smoking status: Never Smoker  . Smokeless tobacco: Never Used  Substance Use Topics  . Alcohol use: Yes    Alcohol/week: 0.0 - 1.0 standard drinks   Past Surgical History:  Procedure Laterality Date  . NO PAST SURGERIES     Family History  Problem Relation Age of Onset  . Alcohol abuse Mother   . Skin cancer Maternal Grandmother    Review of Systems  Constitutional: Negative for chills, fatigue and fever.  HENT: Negative for congestion, ear pain, sinus pain and sore throat.   Eyes: Negative.   Respiratory: Negative for cough, shortness of breath and wheezing.   Cardiovascular: Negative for chest pain, palpitations and leg swelling.  Gastrointestinal: Negative for abdominal pain, diarrhea,  nausea and vomiting.  Genitourinary: Negative for dysuria, frequency and urgency.  Musculoskeletal: +bumps on pubic area/penis  Skin: Negative for color change, pallor and rash.  Neurological: Negative for syncope, light-headedness and headaches.  Psychiatric/Behavioral: The patient is not nervous/anxious.       Objective:   Physical Exam Vitals signs and nursing note reviewed.  Constitutional:      General: He is not in acute distress.    Appearance: He is not toxic-appearing.  HENT:     Head: Normocephalic and atraumatic.     Nose: Nose normal.     Mouth/Throat:     Mouth: Mucous membranes are moist.  Eyes:     General: No scleral icterus.    Extraocular Movements: Extraocular movements intact.     Conjunctiva/sclera: Conjunctivae normal.  Neck:     Musculoskeletal: Neck supple. No neck rigidity.  Cardiovascular:     Rate and Rhythm: Normal rate and regular rhythm.  Pulmonary:     Effort: Pulmonary effort is normal. No respiratory distress.     Breath sounds: Normal breath sounds.  Abdominal:     Hernia: There is no hernia in the right inguinal area or left inguinal area.  Genitourinary:    Pubic Area: No pubic lice.      Penis: Normal.      Scrotum/Testes: Normal.     Epididymis:     Right:  Normal.     Left: Normal.       Comments: Skin bumps across pubic area consistent with patient shaving pubic hair and hair is growing back becoming slightly ingrown.  2 small bumps and shaft of penis.  Do appear slightly reddened in color.  Bumps across pubic area and on shaft.  Did all.  The same time.  He does shave the shaft as well at times.  Patient has no concerns for STDs and has never had outbreak of genital warts or herpes before. No discharge from penis.  Lymphadenopathy:     Cervical: No cervical adenopathy.     Lower Body: No right inguinal adenopathy. No left inguinal adenopathy.  Skin:    General: Skin is warm and dry.     Findings: Erythema present.  Neurological:       Mental Status: He is alert and oriented to person, place, and time.    Vitals:   05/16/18 1013  BP: 118/80  Pulse: 72  Resp: 16  Temp: 98.2 F (36.8 C)  SpO2: 98%      Assessment & Plan:   A total of 25  minutes were spent face-to-face with the patient during this encounter and over half of that time was spent on counseling and coordination of care. The patient was counseled on possible causes of bumps on skin including ingrown hairs, skin irritation, folliculitis, STDs.  Also discussed skin exfoliation and using soap or shaving cream when shaving.  Folliculitis of pubic region- I do suspect patient's skin redness and bumps are related to a skin irritation/infection from him shaving his pubic hair.  Patient advised to get a gentle exfoliate and to remove the dead skin cells and allow the pubic hair to grow regularly back through the skin.  Also encouraged when he does shave to use soap or shaving cream to help prepare the skin for shaving.  Also encouraged to get a new razor for shaving.   Patient will do exfoliation of skin and monitor his progress over the next 1 to 2 weeks.  If area does not resolve, he has been advised to return to clinic for reevaluation.  It is possible that the 2 small spots on the shaft could be a genital wart, but they did appear at the same time that the red bumps along the pubic area after shaving so I am not 100% convinced that it is genital warts.  Patient advised to abstain from sexual activity until all the symptoms resolve  Offered to make follow-up appointment here in 1 to 2 weeks, but he declines states he will call and let me know if he needs to return for reevaluation.

## 2018-05-18 ENCOUNTER — Encounter: Payer: Self-pay | Admitting: Family Medicine

## 2018-05-18 ENCOUNTER — Ambulatory Visit (INDEPENDENT_AMBULATORY_CARE_PROVIDER_SITE_OTHER): Payer: BLUE CROSS/BLUE SHIELD | Admitting: Family Medicine

## 2018-05-18 ENCOUNTER — Encounter: Payer: Managed Care, Other (non HMO) | Admitting: Family Medicine

## 2018-05-18 VITALS — BP 122/78 | HR 83 | Temp 98.0°F | Resp 16 | Ht 68.0 in | Wt 236.8 lb

## 2018-05-18 DIAGNOSIS — E669 Obesity, unspecified: Secondary | ICD-10-CM

## 2018-05-18 DIAGNOSIS — G4733 Obstructive sleep apnea (adult) (pediatric): Secondary | ICD-10-CM

## 2018-05-18 DIAGNOSIS — L739 Follicular disorder, unspecified: Secondary | ICD-10-CM

## 2018-05-18 NOTE — Progress Notes (Signed)
Subjective:    Patient ID: Clarence Simmons, male    DOB: [birth year], 28 y.o.   MRN: [id number]  HPI  Presents to clinic as TOC from Dr Adriana Simas.   Main concern today is getting a new referral back to pulmonology to follow-up on the sleep apnea.  Last time he had a sleep study or had his CPAP machine calibrated was back in 2014 or 2015.    Denies fatigue.  Denies shortness of breath or wheezing.  Denies chest pain or cough.  Patient reports he has a history of enlarged tonsils and he is also been working on eating a healthier diet to lose weight.  Patient also states he has been using some exfoliating soap and his skin seems to be improving in the pubic area.  Patient Active Problem List   Diagnosis Date Noted  . Chronic fatigue 08/02/2016  . OSA (obstructive sleep apnea) 05/06/2015  . Preventative health care 05/06/2015  . Obesity (BMI 30.0-34.9) 05/06/2015   Social History   Tobacco Use  . Smoking status: Never Smoker  . Smokeless tobacco: Never Used  Substance Use Topics  . Alcohol use: Yes    Alcohol/week: 0.0 - 1.0 standard drinks   Past Surgical History:  Procedure Laterality Date  . NO PAST SURGERIES     Family History  Problem Relation Age of Onset  . Alcohol abuse Mother   . Skin cancer Maternal Grandmother     Review of Systems  Constitutional: Negative for chills, fatigue and fever.  HENT: Negative for congestion, ear pain, sinus pain and sore throat.   Eyes: Negative.   Respiratory: Negative for cough, shortness of breath and wheezing.   Cardiovascular: Negative for chest pain, palpitations and leg swelling.  Gastrointestinal: Negative for abdominal pain, diarrhea, nausea and vomiting.  Genitourinary: Negative for dysuria, frequency and urgency.  Musculoskeletal: Negative for arthralgias and myalgias.  Skin: Negative for color change, pallor and rash.  Neurological: Negative for syncope, light-headedness and headaches.  Psychiatric/Behavioral: The  patient is not nervous/anxious.       Objective:   Physical Exam Vitals signs and nursing note reviewed.  Constitutional:      General: He is not in acute distress.    Appearance: He is not toxic-appearing.  HENT:     Head: Normocephalic.     Right Ear: Tympanic membrane, ear canal and external ear normal.     Left Ear: Tympanic membrane, ear canal and external ear normal.     Nose: Nose normal.     Mouth/Throat:     Mouth: Mucous membranes are moist.     Pharynx: No oropharyngeal exudate or posterior oropharyngeal erythema.     Comments: Patient does have large tonsils Eyes:     General: No scleral icterus.    Extraocular Movements: Extraocular movements intact.     Conjunctiva/sclera: Conjunctivae normal.     Pupils: Pupils are equal, round, and reactive to light.  Neck:     Musculoskeletal: Normal range of motion and neck supple. No neck rigidity or muscular tenderness.     Vascular: No carotid bruit.  Cardiovascular:     Rate and Rhythm: Normal rate and regular rhythm.  Pulmonary:     Effort: Pulmonary effort is normal. No respiratory distress.     Breath sounds: Normal breath sounds. No wheezing, rhonchi or rales.  Abdominal:     General: Bowel sounds are normal.     Palpations: Abdomen is soft.  Lymphadenopathy:     Cervical:  No cervical adenopathy.  Skin:    General: Skin is warm and dry.     Coloration: Skin is not pale.  Neurological:     Mental Status: He is alert and oriented to person, place, and time.  Psychiatric:        Mood and Affect: Mood normal.        Behavior: Behavior normal.    Vitals:   05/18/18 1429  BP: 122/78  Pulse: 83  Resp: 16  Temp: 98 F (36.7 C)  SpO2: 96%       Assessment & Plan:   Obstructive sleep apnea-we will put a new referral to pulmonology for reevaluation of his sleep apnea and possible new sleep study.  He will continue use of current CPAP machine at this time.  Obesity-patient encouraged to continue work on  healthy diet and regular exercise.  Discussed a diet high in lean proteins, lots of vegetables and lower carbs/sugars.  Also advised to keep up good water intake.  Folliculitis-per patient skin is improving.  It is only been a few days since last visit.  He will keep me updated if skin does not improve as expected.  Offered blood work, he declines at this time due to his insurance coverage not being so great for lab testing.  States he will most likely get blood work in the future but not today.  Patient aware he can return to clinic anytime for issues, he will also come to clinic annually for complete physical exam.

## 2018-05-24 ENCOUNTER — Telehealth: Payer: Self-pay | Admitting: Family Medicine

## 2018-05-24 DIAGNOSIS — A630 Anogenital (venereal) warts: Secondary | ICD-10-CM

## 2018-05-24 NOTE — Telephone Encounter (Signed)
Copied from CRM [id number]. Topic: Quick Communication - See Telephone Encounter >> May 24, 2018  3:27 PM Lorrine Kin, NT wrote: CRM for notification. See Telephone encounter for: 05/24/18. Patient calling and states that he was advised to try using exfoliating cream for the bumps that he has. States that the exfoliating cream is not working and would like to know what Leotis Shames would like to do next?  CB#: [phone] [id number] [place], [place] - [address]

## 2018-05-24 NOTE — Telephone Encounter (Signed)
Pt called [facility] [employer] for notification. See Telephone encounter for: 05/24/18. Patient calling and states that he was advised to try using exfoliating cream for the bumps that he has. States that the exfoliating cream is not working and would like to know what Leotis Shames would like to do next?  CB#: [phone] [id number] [place], [place] - [address]

## 2018-05-25 MED ORDER — IMIQUIMOD 5 % EX CREA: TOPICAL_CREAM | CUTANEOUS | 0 refills | Status: DC

## 2018-05-25 NOTE — Telephone Encounter (Signed)
I am not convinced the bumps are warts, but we can try the mild topical wart treatment  I would advise he continue to use mild exfoliation soap and not shave hair for at least next 2 weeks

## 2018-05-28 ENCOUNTER — Telehealth: Payer: Self-pay

## 2018-05-28 NOTE — Telephone Encounter (Signed)
Called Pt with advise from NP Lauren Guse to use mild exfoliating soap and not to shave for 2 weeks. Pt stated he understands with no questions at this time.

## 2018-05-28 NOTE — Telephone Encounter (Signed)
Copied from CRM [id number]. Topic: Quick Communication - See Telephone Encounter >> May 24, 2018  3:27 PM Lorrine Kin, NT wrote: CRM for notification. See Telephone encounter for: 05/24/18. Patient calling and states that he was advised to try using exfoliating cream for the bumps that he has. States that the exfoliating cream is not working and would like to know what Leotis Shames would like to do next?  CB#: [phone] [id number] [place] [facility] - [id number] >> May 25, 2018  5:01 PM Uvaldo Rising, [place] wrote: Pt returned call to office. Pt requests call back.

## 2019-01-03 ENCOUNTER — Ambulatory Visit: Payer: BLUE CROSS/BLUE SHIELD | Attending: Family Medicine

## 2019-01-03 ENCOUNTER — Other Ambulatory Visit: Payer: Self-pay

## 2019-01-03 DIAGNOSIS — S2232XS Fracture of one rib, left side, sequela: Secondary | ICD-10-CM | POA: Insufficient documentation

## 2019-01-03 NOTE — Therapy (Signed)
[place] MAIN [facility] SERVICES [address] [place], [place], [address] Phone: [phone]   Fax:  [phone]  Patient Details  Name: Clarence Simmons MRN: [id number] Date of Birth: 19-Nov-1990 Referring Provider:  Jodelle Green, FNP  Encounter Date: 01/03/2019  PT/OT/SLP Screening Form   Time: in______     Time out_____   Complaint L rib pain  Past Medical Hx: hx of broken ribs on R Injury Date:3 weeks ago with re-injury Sunday  Pain Scale:rest 3-4/10, up to 9/10 with palpation Patient's phone number:   Hx (this occurrence):  Left rib pain similar to previous pain of rib fx 3 weeks ago, fell after getting dizzy. Ibuprofen and heat/ice for pain reduction. Pain was improving, wife hugged him Sunday and then pain has come back.     Assessment: +tuning fork to first floating rib on L, painful to palpation, excessive swelling and adhesions noted. No bruising noted.  Educated on need for x ray, icing, taping, and symptoms.   Recommendations:    Comments: Patient presents with high indication of fracture/impaired bone structure of first floating rib on L . Patient educated on need for x ray due to location of rib and danger to organs if worsens. Educated on avoiding flexion of trunk, twisting, and rotating. Use of ice for swelling reduction. Patient agreeable.     [x]  Patient would benefit from an MD referral (for x ray) []  Patient would benefit from a full PT/OT/ SLP evaluation and treatment. []  No intervention recommended at this time.  Janna Arch, PT, DPT   01/03/2019, 8:33 AM  [place] MAIN [facility] SERVICES [address] [place], [place], [address] Phone: [phone]   Fax:  [phone]

## 2019-05-20 ENCOUNTER — Encounter: Payer: BLUE CROSS/BLUE SHIELD | Admitting: Family Medicine

## 2020-02-24 ENCOUNTER — Telehealth: Payer: BLUE CROSS/BLUE SHIELD | Admitting: Physician Assistant

## 2020-02-24 DIAGNOSIS — J019 Acute sinusitis, unspecified: Secondary | ICD-10-CM

## 2020-02-24 MED ORDER — DOXYCYCLINE HYCLATE 100 MG PO CAPS: 100.0000 mg | ORAL_CAPSULE | Freq: Two times a day (BID) | ORAL | 0 refills | Status: DC

## 2020-02-24 NOTE — Progress Notes (Signed)

## 2020-07-20 ENCOUNTER — Ambulatory Visit (HOSPITAL_BASED_OUTPATIENT_CLINIC_OR_DEPARTMENT_OTHER): Payer: Self-pay | Admitting: Nurse Practitioner

## 2020-07-27 ENCOUNTER — Other Ambulatory Visit (HOSPITAL_BASED_OUTPATIENT_CLINIC_OR_DEPARTMENT_OTHER)
Admission: RE | Admit: 2020-07-27 | Discharge: 2020-07-27 | Disposition: A | Discharge status: Home / Self Care | Payer: 59 | Source: Ambulatory Visit | Attending: Nurse Practitioner | Admitting: Nurse Practitioner

## 2020-07-27 ENCOUNTER — Other Ambulatory Visit: Payer: Self-pay

## 2020-07-27 ENCOUNTER — Encounter (HOSPITAL_BASED_OUTPATIENT_CLINIC_OR_DEPARTMENT_OTHER): Payer: Self-pay | Admitting: Nurse Practitioner

## 2020-07-27 ENCOUNTER — Ambulatory Visit (INDEPENDENT_AMBULATORY_CARE_PROVIDER_SITE_OTHER): Payer: 59 | Admitting: Nurse Practitioner

## 2020-07-27 VITALS — BP 128/81 | HR 86 | Ht 70.0 in | Wt 252.8 lb

## 2020-07-27 DIAGNOSIS — Z7689 Persons encountering health services in other specified circumstances: Secondary | ICD-10-CM | POA: Diagnosis not present

## 2020-07-27 DIAGNOSIS — G4733 Obstructive sleep apnea (adult) (pediatric): Secondary | ICD-10-CM | POA: Diagnosis not present

## 2020-07-27 DIAGNOSIS — Z0000 Encounter for general adult medical examination without abnormal findings: Secondary | ICD-10-CM | POA: Insufficient documentation

## 2020-07-27 DIAGNOSIS — Z9989 Dependence on other enabling machines and devices: Secondary | ICD-10-CM

## 2020-07-27 DIAGNOSIS — Z6836 Body mass index (BMI) 36.0-36.9, adult: Secondary | ICD-10-CM | POA: Insufficient documentation

## 2020-07-27 LAB — CBC WITH DIFFERENTIAL/PLATELET
Abs Immature Granulocytes: 0.01 10*3/uL (ref 0.00–0.07)
Basophils Absolute: 0.1 10*3/uL (ref 0.0–0.1)
Basophils Relative: 1 %
Eosinophils Absolute: 0.2 10*3/uL (ref 0.0–0.5)
Eosinophils Relative: 2 %
HCT: 46 % (ref 39.0–52.0)
Hemoglobin: 15.7 g/dL (ref 13.0–17.0)
Immature Granulocytes: 0 %
Lymphocytes Relative: 32 %
Lymphs Abs: 2.5 10*3/uL (ref 0.7–4.0)
MCH: 29.7 pg (ref 26.0–34.0)
MCHC: 34.1 g/dL (ref 30.0–36.0)
MCV: 87 fL (ref 80.0–100.0)
Monocytes Absolute: 0.5 10*3/uL (ref 0.1–1.0)
Monocytes Relative: 6 %
Neutro Abs: 4.7 10*3/uL (ref 1.7–7.7)
Neutrophils Relative %: 59 %
Platelets: 299 10*3/uL (ref 150–400)
RBC: 5.29 MIL/uL (ref 4.22–5.81)
RDW: 12.8 % (ref 11.5–15.5)
WBC: 8 10*3/uL (ref 4.0–10.5)
nRBC: 0 % (ref 0.0–0.2)

## 2020-07-27 NOTE — Assessment & Plan Note (Signed)
Review of current and past medical history, family history, and SDOH today.  CPE performed today with labs.

## 2020-07-27 NOTE — Assessment & Plan Note (Signed)
No management of CPAP in quite some time with symptoms positive for poor sleep quality including day time sleepiness, snoring, and somnolence.  Recommend referral to neurology for updated sleep study and recommendations for management.  He is interested in [place] implant and if this would benefit him.  Referral placed

## 2020-07-27 NOTE — Assessment & Plan Note (Signed)
BMI 36.27 today. Recommend increased physical activity of at least 20 minutes per day walking. Recommend avoidance of high fat foods and carbohydrates and limited emotional eating with small treats during these periods as opposed to large quantities.  Hopeful he will have more energy to exercise and less stress once OSA is better controlled.  Labs today. Will make changes to the plan of care based on lab results as necessary.

## 2020-07-27 NOTE — Progress Notes (Signed)
New Patient Office Visit  Subjective:  Patient ID: Clarence Simmons, male    DOB: 11-21-1990  Age: 30 y.o. MRN: [id number]  CC:  Chief Complaint  Patient presents with  . Establish Care    HPI Clarence Simmons is a 30 year old male who presents today to establish care. Endorses sleep apnea since about the age of 46 with CPAP use nightly.  No current management of sleep apnea or machine settings.  Endorses significant daytime fatigue, somnolence, mental fog, and snoring nightly. Endorses nightly use of CPAP with full face mask.  Sleeps on abdomen to help prevent/reduce snoring.  Requesting referral for sleep study and re-evaluation of CPAP.  Would like information on [employer] to see if he qualifies.   Narrative: 30 year old male married to [place] with 84 year old daughter at home. No significant health history aside from sleep apnea and intermittent sinus infections.  He works for [employer] and is currently in school for psychology.  Endorses a mixed diet of healthy and unhealthy options. Does endorse emotional eating with both joy and stress.  Activities include hiking and playing with his daughter- reports fairly active lifestyle outside of work.  Does not use nicotine or recreational drugs. Alcohol use of less than 1-2 per month. Currently sexually active with one partner (wife) with no concerns for STI.  Endorses lifelong history of diarrhea with improvement/resolution with daily Activia yogurt for breakfast.  Endorses some depression and anxiety symptoms that he correlates with not sleeping well due to not well managed sleep apnea.    Depression screen [facility] 2/9 07/27/2020 08/01/2016  Decreased Interest 1 2  Down, Depressed, Hopeless 1 0  PHQ - 2 Score 2 2  Altered sleeping 2 0  Tired, decreased energy 3 3  Change in appetite 2 3  Feeling bad or failure about yourself  2 0  Trouble concentrating 1 1  Moving slowly or fidgety/restless 0 0  Suicidal thoughts 0 0  PHQ-9  Score 12 9  Difficult doing work/chores Somewhat difficult Very difficult   GAD 7 : Generalized Anxiety Score 07/27/2020  Nervous, Anxious, on Edge 2  Control/stop worrying 0  Worry too much - different things 1  Trouble relaxing 2  Restless 2  Easily annoyed or irritable 0  Afraid - awful might happen 0  Total GAD 7 Score 7  Anxiety Difficulty Not difficult at all    Past Medical History:  Diagnosis Date  . Chicken pox   . Chronic fatigue 08/02/2016  . Obesity (BMI 30.0-34.9) 05/06/2015  . OSA (obstructive sleep apnea) 05/06/2015  . Preventative health care 05/06/2015  . Sleep apnea     Past Surgical History:  Procedure Laterality Date  . NO PAST SURGERIES      Family History  Problem Relation Age of Onset  . Alcohol abuse Mother   . Cancer Mother   . Hypertension Mother   . Skin cancer Maternal Grandmother   . Cancer Maternal Grandmother   . Cancer Maternal Grandfather     Social History   Socioeconomic History  . Marital status: Married    Spouse name: Not on file  . Number of children: Not on file  . Years of education: Not on file  . Highest education level: Not on file  Occupational History  . Not on file  Tobacco Use  . Smoking status: Never Smoker  . Smokeless tobacco: Never Used  Vaping Use  . Vaping Use: Never used  Substance  and Sexual Activity  . Alcohol use: Yes    Alcohol/week: 0.0 - 1.0 standard drinks    Comment: ocassionally  . Drug use: No  . Sexual activity: Yes  Other Topics Concern  . Not on file  Social History Narrative  . Not on file   Social Determinants of Health   Financial Resource Strain: Not on file  Food Insecurity: Not on file  Transportation Needs: Not on file  Physical Activity: Not on file  Stress: Not on file  Social Connections: Not on file  Intimate Partner Violence: Not on file    ROS Review of Systems All review of systems negative except what is listed in the HPI  Objective:   Today's Vitals: BP 128/81    Pulse 86   Ht 5\' 10"  (1.778 m)   Wt 252 lb 12.8 oz (114.7 kg)   SpO2 98%   BMI 36.27 kg/m   Physical Exam Vitals and nursing note reviewed.  Constitutional:      Appearance: Normal appearance.  HENT:     Head: Normocephalic and atraumatic.     Right Ear: Tympanic membrane normal.     Left Ear: Tympanic membrane normal.  Eyes:     Extraocular Movements: Extraocular movements intact.     Conjunctiva/sclera: Conjunctivae normal.     Pupils: Pupils are equal, round, and reactive to light.  Neck:     Vascular: No carotid bruit.  Cardiovascular:     Rate and Rhythm: Normal rate and regular rhythm.     Pulses: Normal pulses.     Heart sounds: Normal heart sounds. No murmur heard.   Pulmonary:     Effort: Pulmonary effort is normal.     Breath sounds: Normal breath sounds. No wheezing.  Abdominal:     General: Bowel sounds are normal. There is no distension.     Palpations: Abdomen is soft.     Tenderness: There is no abdominal tenderness. There is no right CVA tenderness, left CVA tenderness or guarding.  Musculoskeletal:        General: Normal range of motion.     Cervical back: Normal range of motion and neck supple. No rigidity or tenderness.     Right lower leg: No edema.     Left lower leg: No edema.  Lymphadenopathy:     Cervical: No cervical adenopathy.  Skin:    General: Skin is warm and dry.     Capillary Refill: Capillary refill takes less than 2 seconds.  Neurological:     General: No focal deficit present.     Mental Status: He is alert and oriented to person, place, and time.  Psychiatric:        Mood and Affect: Mood normal.        Behavior: Behavior normal.        Thought Content: Thought content normal.        Judgment: Judgment normal.     Assessment & Plan:   Problem List Items Addressed This Visit      Respiratory   OSA on CPAP    No management of CPAP in quite some time with symptoms positive for poor sleep quality including day time  sleepiness, snoring, and somnolence.  Recommend referral to neurology for updated sleep study and recommendations for management.  He is interested in [place] implant and if this would benefit him.  Referral placed      Relevant Orders   Ambulatory referral to Neurology     Other  Encounter to establish care - Primary    Review of current and past medical history, family history, and SDOH today.  CPE performed today with labs.        Physical exam    CPE today with labs without significant findings.  Referral placed for neurology for OSA.  Will make changes to plan of care as necessary based on lab results.       Relevant Orders   CBC with Differential/Platelet (Completed)   Comprehensive metabolic panel   Lipid panel   Body mass index (BMI) of 36.0-36.9 in adult    BMI 36.27 today. Recommend increased physical activity of at least 20 minutes per day walking. Recommend avoidance of high fat foods and carbohydrates and limited emotional eating with small treats during these periods as opposed to large quantities.  Hopeful he will have more energy to exercise and less stress once OSA is better controlled.  Labs today. Will make changes to the plan of care based on lab results as necessary.          Outpatient Encounter Medications as of 07/27/2020  Medication Sig  . [DISCONTINUED] doxycycline (VIBRAMYCIN) 100 MG capsule Take 1 capsule (100 mg total) by mouth 2 (two) times daily.  . [DISCONTINUED] imiquimod (ALDARA) 5 % cream Apply topically 3 (three) times a week.   No facility-administered encounter medications on file as of 07/27/2020.    Follow-up: Return in about 1 year (around 07/27/2021) for labs today, please. CPE done today. Tollie Eth, NP

## 2020-07-27 NOTE — Assessment & Plan Note (Signed)
CPE today with labs without significant findings.  Referral placed for neurology for OSA.  Will make changes to plan of care as necessary based on lab results.

## 2020-07-27 NOTE — Patient Instructions (Signed)
I have sent in the referral for neurology for sleep evaluation. You should hear from them in the next week or so.   I would like for you to get labs today for evaluation of basic labs. You will see the results as soon as they come through. We will let you know if we need to address anything.    ___________________________________________________________ Thank you for choosing [facility] for your Primary Care needs. I am excited for the opportunity to partner with you to meet your health care goals. It was a pleasure meeting you today!  I am an Adult-Geriatric Nurse Practitioner with a background in caring for patients for more than 20 years. I received my Paediatric nurse in Nursing and my Doctor of Nursing Practice degrees at [employer]. I received additional fellowship training in primary care and sports medicine after receiving my doctorate degree. I provide primary care and sports medicine services to patients age 24 and older within this office. I am also a provider with the [facility] and the director of the APP Fellowship with [facility].  I am a [place] native, but have called the [place] area home for nearly 20 years and am proud to be a member of this community.   I am passionate about providing the best service to you through preventive medicine and supportive care. I consider you a part of the medical team and value your input. I work diligently to ensure that you are heard and your needs are met in a safe and effective manner. I want you to feel comfortable with me as your provider and want you to know that your health concerns are important to me.   For your information, our office hours are Monday- Friday 8:00 AM - 5:00 PM At this time I am not in the office on Wednesdays.  If you have questions or concerns, please call our office at [phone] or send [place] a MyChart message and we will respond as  quickly as possible.   For all urgent or time sensitive needs we ask that you please call the office to avoid delays. MyChart is not constantly monitored and replies may take up to 72 business hours.  MyChart Policy: . MyChart allows for you to see your visit notes, after visit summary, provider recommendations, lab and tests results, make an appointment, request refills, and contact your provider or the office for non-urgent questions or concerns.  . Providers are seeing patients during normal business hours and do not have built in time to review MyChart messages. We ask that you allow a minimum of 72 business hours for MyChart message responses.  . Complex MyChart concerns may require a visit. Your provider may request you schedule a virtual or in person visit to ensure we are providing the best care possible. . MyChart messages sent after 4:00 PM on Friday will not be received by the provider until Monday morning.    Lab and Test Results: . You will receive your lab and test results on MyChart as soon as they are completed and results have been sent by the lab or testing facility. Due to this service, you will receive your results BEFORE your provider.  . Please allow a minimum of 72 business hours for your provider to receive and review lab and test results and contact you about.   . Most lab and test result comments from the provider will be sent through [facility].  Your provider may recommend changes to the plan of care, follow-up visits, repeat testing, ask questions, or request an office visit to discuss these results. You may reply directly to this message or call the office at [phone] to provide information for the provider or set up an appointment. . In some instances, you will be called with test results and recommendations. Please let us know if this is preferred and we will make note of this in your chart to provide this for you.    . If you have not heard a response to your lab or  test results in 72 business hours, please call the office to let us know.   After Hours: . For all non-emergency after hours needs, please call the office at [phone] and select the option to reach the on-call provider service. On-call services are shared between multiple [facility] offices and therefore it will not be possible to speak directly with your provider. On-call providers may provide medical advice and recommendations, but are unable to provide refills for maintenance medications.  . For all emergency or urgent medical needs after normal business hours, we recommend that you seek care at the closest Urgent Care or Emergency Department to ensure appropriate treatment in a timely manner.  [facility] has a 24 hour emergency room located on the ground floor for your convenience.    Please do not hesitate to reach out to [place] with concerns.   Thank you, again, for choosing me as your health care partner. I appreciate your trust and look forward to learning more about you.   Worthy Keeler, DNP, AGNP-c ___________________________________________________________________________________

## 2020-07-29 ENCOUNTER — Telehealth (HOSPITAL_BASED_OUTPATIENT_CLINIC_OR_DEPARTMENT_OTHER): Payer: Self-pay

## 2020-07-29 NOTE — Telephone Encounter (Signed)
Results released by Sarabeth Early, AGNP and reviewed by patient via MyChart. Instructed patient to contact the office with any questions or concerns.  

## 2020-07-29 NOTE — Telephone Encounter (Signed)
-----   Message from Tollie Eth, NP sent at 07/29/2020  7:28 AM EDT ----- Labs look great! No concerns.

## 2020-07-29 NOTE — Progress Notes (Signed)
Labs look great! No concerns.

## 2020-08-03 ENCOUNTER — Telehealth (HOSPITAL_BASED_OUTPATIENT_CLINIC_OR_DEPARTMENT_OTHER): Payer: Self-pay

## 2020-08-03 NOTE — Telephone Encounter (Signed)
Patient called regarding sleep study referral.  He was given [facility] information and instructed to call and schedule appointment.

## 2020-10-01 ENCOUNTER — Encounter: Payer: Self-pay | Admitting: Neurology

## 2020-10-01 ENCOUNTER — Ambulatory Visit (INDEPENDENT_AMBULATORY_CARE_PROVIDER_SITE_OTHER): Payer: 59 | Admitting: Neurology

## 2020-10-01 VITALS — BP 140/90 | HR 102 | Ht 70.0 in | Wt 254.5 lb

## 2020-10-01 DIAGNOSIS — G478 Other sleep disorders: Secondary | ICD-10-CM | POA: Diagnosis not present

## 2020-10-01 DIAGNOSIS — G4719 Other hypersomnia: Secondary | ICD-10-CM | POA: Insufficient documentation

## 2020-10-01 DIAGNOSIS — G4733 Obstructive sleep apnea (adult) (pediatric): Secondary | ICD-10-CM

## 2020-10-01 DIAGNOSIS — Z9989 Dependence on other enabling machines and devices: Secondary | ICD-10-CM | POA: Diagnosis not present

## 2020-10-01 DIAGNOSIS — R0683 Snoring: Secondary | ICD-10-CM | POA: Insufficient documentation

## 2020-10-01 NOTE — Progress Notes (Signed)
SLEEP MEDICINE CLINIC    Provider:  Melvyn Novasarmen  Daschel Roughton, MD  Primary Care Physician:  Tollie EthEarly, Sara E, NP [address] [place] [id number]     Referring Provider: Tollie EthEarly, Sara E, Np [address] [place] [id number]          Chief Complaint according to patient   Patient presents with:     New Patient (Initial Visit)      Pt here to re evaluate his OSA, pt had a sleep study done when he was 21 and has had the same cpap machine for 9 years. Cpap has been helping and wishes toreceive a new machine. Has been ordering his supplies off of [employer].       HISTORY OF PRESENT ILLNESS:  Clarence Simmons is a 30 y.o. year old Caucasian male patient seen here upon a referral on 10/01/2020 from PCP for a new sleep study .  Chief concern according to patient :   Pt here to re evaluate his OSA, pt had a sleep study done when he was 21 and has had the same cpap machine for 9 years. Cpap has been helping and wishes to receive a new machine. Has been ordering his supplies off of [employer]. Has poor sleep quality for a while - 4 months, before that he felt well. His machine has displayed a message stating the motor is not working. The patient reports that he recalls his sleep apnea was rated as severe and was associated with significant hypoxemia, he now snores again nightly in spite of using his CPAP which means that either the pressure does not control it or his supplies are not fitting him anymore or adding in need of replacement he endorsed some depression and a recent depression screening the FHQ29 on 2 May was 12 points he does not have a significant anxiety blood pressures in his primary care office were normal range.  Body mass index has remained just above 35 for the last 3 or 5 years.  His download shows 97% compliance with an average use at time of 6 hours 47 minutes the CPAP is set at 11 cm water pressure was 2 cm pressure expiratory relief his residual AHI is  1.6 which is excellent if his apnea baseline was severe.  He does have a lot of air leakage.  The 95th percentile is 58 L/min.  There is no change talking noted on his download.     I have the pleasure of seeing Clarence Simmons today, a right-handed White or Caucasian male with a possible sleep disorder. He   has a past medical history of Chicken pox, Chronic fatigue (08/02/2016), Obesity (BMI 30.0-34.9) (05/06/2015), OSA (obstructive sleep apnea) (05/06/2015), Preventative health care (05/06/2015), CPAP dependent.   His only sleep study took place at age 30. E was weighting  more than now at that time, 270. He had nocturia at the time, no longer since being on CPAP   Sleep relevant medical history: , no Tonsillectomy.   Family medical /sleep history: no  other family member on CPAP with OSA, insomnia, sleep walkers.    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 The patient currently works/fluent hours, 2 times a month on night shift. Work week may be up to 80 hours.  Tobacco use; none .  ETOH use ; 1 /month,  Caffeine intake  in form of Monster tea /energy drinks. 180 mg to 300 mg caffeine .  Regular exercise ;none Hobbies : no time.     Sleep habits are as follows: The patient's dinner time is variable between 4-6 PM. The patient goes to bed at 12 PM and continues to sleep for 5-7 hours, no longer wakes for  bathroom breaks. The preferred sleep position is prone , with the support of 1 pillow. History of GERD, not current- Dreams are reportedly infrequent/vivid.  7.30  AM is the usual rise time. The patient wakes up spontaneously.  He reports not feeling refreshed or restored in AM, with symptoms such as dry mouth, morning headaches- related to migraine, tension neck pain- , and residual fatigue. Naps are taken infrequently.   Review of Systems: Out of a complete 14 system review, the patient complains of  only the following symptoms, and all other reviewed systems are negative.:  Fatigue, sleepiness , snoring while on CPAP now- breakthrough snoring- Patient has  non restorative sleep .  How likely are you to doze in the following situations: 0 = not likely, 1 = slight chance, 2 = moderate chance, 3 = high chance   Sitting and Reading? Watching Television? Sitting inactive in a public place (theater or meeting)? As a passenger in a car for an hour without a break? Lying down in the afternoon when circumstances permit? Sitting and talking to someone? Sitting quietly after lunch without alcohol? In a car, while stopped for a few minutes in traffic?   Total = 11/  24 points   FSS endorsed at 40/ 63 points.   Social History   Socioeconomic History   Marital status: Married    Spouse name: Not on file   Number of children: 1   Years of education: Not on file   Highest education level: Associate degree: occupational, Scientist, product/process development, or vocational program  Occupational History   Not on file  Tobacco Use   Smoking status: Never   Smokeless tobacco: Never  Vaping Use   Vaping Use: Never used  Substance and Sexual Activity   Alcohol use: Yes    Alcohol/week: 0.0 - 1.0 standard drinks    Comment: ocassionally, once a month   Drug use: No   Sexual activity: Yes  Other Topics Concern   Not on file  Social History Narrative   Lives with his wife and 1 child   Caffeine: 300mg     Right handed   Social Determinants of Health   Financial Resource Strain: Not on file  Food Insecurity: Not on file  Transportation Needs: Not on file  Physical Activity: Not on file  Stress: Not on file  Social Connections: Not on file    Family History  Problem Relation Age of Onset   Alcohol abuse Mother    Cancer Mother    Hypertension Mother    Skin cancer Maternal Grandmother    Cancer Maternal Grandmother    Cancer Maternal Grandfather     Past Medical History:  Diagnosis Date   Chicken  pox    Chronic fatigue 08/02/2016   Obesity (BMI 30.0-34.9) 05/06/2015   OSA (obstructive sleep apnea) 05/06/2015   Preventative health care 05/06/2015   Sleep apnea     Past Surgical History:  Procedure Laterality Date   NO PAST SURGERIES       No current outpatient medications on file prior to visit.   No current facility-administered medications on file prior to visit.    Allergies  Allergen Reactions   Amoxicillin Nausea Only    Physical exam:  Today's Vitals   10/01/20 1245  BP: 140/90  Pulse: (!) 102  Weight: 254 lb 8 oz (115.4 kg)  Height: 5\' 10"  (1.778 m)   Body mass index is 36.52 kg/m.   Wt Readings from Last 3 Encounters:  10/01/20 254 lb 8 oz (115.4 kg)  07/27/20 252 lb 12.8 oz (114.7 kg)  05/18/18 236 lb 12.8 oz (107.4 kg)     Ht Readings from Last 3 Encounters:  10/01/20 5\' 10"  (1.778 m)  07/27/20 5\' 10"  (1.778 m)  05/18/18 5\' 8"  (1.727 m)      General: The patient is awake, alert and appears not in acute distress. The patient is well groomed. Head: Normocephalic, atraumatic. Neck is supple. Mallampati 3, small oral opening.  neck circumference:19 inches . Nasal airflow  patent.  wore braces.  Dental status:  Cardiovascular:  Regular rate and cardiac rhythm by pulse,  without distended neck veins. Respiratory: Lungs are clear to auscultation.  Skin:  Without evidence of ankle edema, or rash. Trunk: The patient's posture is erect.   Neurologic exam : The patient is awake and alert, oriented to place and time.   Memory subjective described as intact.  Attention span & concentration ability appears normal.  Speech is fluent,  without  dysarthria, dysphonia or aphasia.  Mood and affect are appropriate.   Cranial nerves: no loss of smell or taste reported  Pupils are equal and briskly reactive to light. Funduscopic exam deferred. .  Extraocular movements in vertical and horizontal planes were intact and without nystagmus. No Diplopia. Visual fields  by finger perimetry are intact. Hearing was intact to soft voice and finger rubbing.    Facial sensation intact to fine touch.  Facial motor strength is symmetric and tongue and uvula move midline.  Neck ROM : limited ROM- scoliosis, since accident trauma age 41. Turned to the right /rotation, tilt and flexion extension were normal for age and shoulder shrug was symmetrical.    Motor exam:  Symmetric bulk, tone and ROM.   Normal tone without cog wheeling, symmetric grip strength .   Sensory:  Fine touch, pinprick and vibration were tested  and  normal.  Proprioception tested in the upper extremities was normal.   Coordination: Rapid alternating movements in the fingers/hands were of normal speed.  The Finger-to-nose maneuver was intact without evidence of ataxia, dysmetria or tremor.   Gait and station: Patient could rise unassisted from a seated position, walked without assistive device.  Stance is of normal width/ base and the patient turned with 3 steps.  Toe and heel walk were deferred.  Deep tendon reflexes: in the  upper and lower extremities are symmetric , brisk and intact.  Babinski response was deferred       After spending a total time of 35  minutes face to face and additional time for physical and neurologic examination, review of laboratory studies,  personal review of imaging studies, reports and results of other testing and review of referral information / records as far as provided in visit, I have established the following assessments:  1) Mr. is CPAP machine has served him well for many years but he has on his current set up too much air leakage and I wonder if he actually has still some hypoxemia and that this may explain part of the fatigue.  He snores now while on CPAP with FFM and facial hair.  Fatigue  can also be provoked by depression and anxiety.  His sleep has not been as restorative and naps are actually making him feel worse so I do think that there is  more of an apnea component.  I like for him to have the quickest available confirmatory test this can be a home sleep test that can be an in lab study what ever his insurance will approve my goal is to replace his CPAP with the same settings or a about the same settings as he currently uses but he will need new supplies so.  I advised the patient about the supply chain problems for CPAP delivery.  If the new baseline showed indicates that hypoxemia is present I will follow-up in the future with a CPAP titration and oximetry overnight to make sure that he is not hypoxic hypoxemic while on CPAP.    My Plan is to proceed with:  1) PSG or HST, if followed by autotitration , combine with ONO while on new CPAP.    I would like to thank Early, Sung Amabile, NP  [address] [place],  [place] [address] for allowing me to meet with and to take care of this pleasant patient.   In short, LATEEF JUNCAJ is presenting with likely under treated OSA/ Hypoxemia and remaining non restorative sleep-, and he has a small oral opening, large neck , elevated BMI.  We briefly spoke about CPAP alternatives/  I plan to follow up either personally or through our NP within 3 month.   CC: I will share my notes with PCP.Marland Kitchen  Electronically signed by: Melvyn Novas, MD 10/01/2020 1:30 PM  [facility] and [facility] Sleep Board certified by The [employer] of Sleep Medicine and Diplomate of the [employer] of Sleep Medicine. Board certified In Neurology through the ABPN, Fellow of the [employer] of Neurology. Medical Director of [facility].

## 2020-10-28 ENCOUNTER — Ambulatory Visit (INDEPENDENT_AMBULATORY_CARE_PROVIDER_SITE_OTHER): Payer: 59 | Admitting: Neurology

## 2020-10-28 DIAGNOSIS — G478 Other sleep disorders: Secondary | ICD-10-CM

## 2020-10-28 DIAGNOSIS — G4719 Other hypersomnia: Secondary | ICD-10-CM

## 2020-10-28 DIAGNOSIS — Z9989 Dependence on other enabling machines and devices: Secondary | ICD-10-CM

## 2020-10-28 DIAGNOSIS — G4733 Obstructive sleep apnea (adult) (pediatric): Secondary | ICD-10-CM | POA: Diagnosis not present

## 2020-10-28 DIAGNOSIS — R0683 Snoring: Secondary | ICD-10-CM

## 2020-11-04 NOTE — Progress Notes (Signed)
    [facility] SLEEP TEST REPORT ( by Watch PAT)   STUDY DATE:  10-27-2020 DOB:  Jul 09, 1990 MRN: [id number]   ORDERING CLINICIAN: Dohmeier, MD  REFERRING CLINICIAN: Georgeanne Nim, NP   CLINICAL INFORMATION/HISTORY: Patient has been a CPAP user since age 30 and gets supplies from the internet. Has the same machine for over 9 years now. Message on CPAP warns it is no longer functioning.  He is CPAP dependent.    Epworth sleepiness score: 11/24.   BMI: 36.5 kg/m   Neck Circumference: 19"   Sleep Summary:   Total Recording Time (hours, min): Was 7 hours and 41 minutes of which 6 hours and 50 minutes were the sleep time.  About 10.3% of the total sleep time were REM sleep.  Respiratory Indices:   Calculated pAHI (per hour): The overall AHI was high at 89/h is much higher in non-REM than REM sleep component the REM sleep AHI was 41.3/h; the non-REM sleep AHI was 94.6/h (!).                   Supine AHI: positional AHI was not established.                                                    Oxygen Saturation Statistics:   Oxygen Saturation (%) Mean:  94%, between 80 and 98% 02 saturation.                            O2 Saturation (minutes) <89%:   Given a 4% 02 desaturation his AHI was still 78.5/h.   The patient spent 2.9% of the total night with an oxygen saturation below 89%              Pulse Rate Statistics:   Pulse Range: The lowest heart rate was 40 bpm with the highest heart rate 114 bpm, mean heart rate was 67.  This home sleep test does not allow to establish a cardiac rhythm.              IMPRESSION:  This HST confirms the presence of severe sleep apnea but the much higher non-REM sleep component is suspicious for either a Cheyne-Stokes respiration contribution or central apnea of other kind..  The patient has been doing well on the 16-year-old CPAP of which we could not retrieve any data.     RECOMMENDATION: I would have much preferred to get him into the lab  for  titration study in an attended form.  His health Plan will not cover that we are going to order a auto titration CPAP with a setting from 5 through 20 cmH2O to centimeter EPR.  I am awaiting the results of that auto titration device use after 30 to 60 days.  The patient should use the mask he is most comfortable with.  Heated humidification will be part of the machines capability.    INTERPRETING PHYSICIAN: Melvyn Novas, MD   Medical Director of [facility] at [facility].

## 2020-11-09 ENCOUNTER — Telehealth: Payer: Self-pay | Admitting: *Deleted

## 2020-11-09 NOTE — Progress Notes (Signed)
IMPRESSION:  This HST confirms the presence of severe sleep apnea but the much higher non-REM sleep component is suspicious for either a Cheyne-Stokes respiration contribution or central apnea of other kind..  The patient has been doing well on the 30-year-old CPAP of which we could not retrieve any data.    RECOMMENDATION: I would have much preferred to get him into the lab for  titration study in an attended form.  His health Plan will not cover that we are going to order a auto titration CPAP with a setting from 5 through 20 cmH2O to centimeter EPR.  I am awaiting the results of that auto titration device use after 30 to 60 days.  The patient should use the mask he is most comfortable with.  Heated humidification will be part of the machines capability.

## 2020-11-09 NOTE — Telephone Encounter (Signed)
I called pt. I advised pt that Dr. Vickey Huger reviewed their sleep study results and found that pt has severe sleep apnea. Dr. Vickey Huger recommends that pt starts auto CPAP. I reviewed PAP compliance expectations with the pt. Pt is agreeable to starting a CPAP. I advised pt that an order will be sent to a DME, [facility], and [facility] will call the pt within about one week after they file with the pt's insurance. [facility] will show the pt how to use the machine, fit for masks, and troubleshoot the CPAP if needed. A follow up appt was made for insurance purposes with Dr. Vickey Huger on 02/08/2021 at 9:30 am. Pt verbalized understanding to arrive 15 minutes early and bring their CPAP. A letter with all of this information in it will be mailed to the pt as a reminder. I verified with the pt that the address we have on file is correct. Pt verbalized understanding of results. Pt had no questions at this time but was encouraged to call back if questions arise. I have sent the order to [facility] and have received confirmation that they have received the order.

## 2020-11-09 NOTE — Telephone Encounter (Signed)
LVM for pt to call about results. °

## 2020-11-09 NOTE — Procedures (Signed)
[facility] SLEEP TEST REPORT ( by Watch PAT)   STUDY DATE:  10-27-2020 DOB:  [birth year] MRN: [id number]   ORDERING CLINICIAN: Rahim Astorga, MD  REFERRING CLINICIAN: Georgeanne Nim, NP   CLINICAL INFORMATION/HISTORY: Patient has been a CPAP user since age 30 and gets supplies from the internet. Has the same machine for over 9 years now. Message on CPAP warns it is no longer functioning.  He is CPAP dependent.    Epworth sleepiness score: 11/24.   BMI: 36.5 kg/m   Neck Circumference: 19"   Sleep Summary:   Total Recording Time (hours, min): Was 7 hours and 41 minutes of which 6 hours and 50 minutes were the sleep time.  About 10.3% of the total sleep time were REM sleep.  Respiratory Indices:   Calculated pAHI (per hour): The overall AHI was high at 89/h is much higher in non-REM than REM sleep component the REM sleep AHI was 41.3/h; the non-REM sleep AHI was 94.6/h (!).                   Supine AHI: positional AHI was not established.                                                    Oxygen Saturation Statistics:   Oxygen Saturation (%) Mean:  94%, between 80 and 98% 02 saturation.                            O2 Saturation (minutes) <89%:   Given a 4% 02 desaturation his AHI was still 78.5/h.   The patient spent 2.9% of the total night with an oxygen saturation below 89%              Pulse Rate Statistics:   Pulse Range: The lowest heart rate was 40 bpm with the highest heart rate 114 bpm, mean heart rate was 67.  This home sleep test does not allow to establish a cardiac rhythm.              IMPRESSION:  This HST confirms the presence of severe sleep apnea but the much higher non-REM sleep component is suspicious for either a Cheyne-Stokes respiration contribution or central apnea of other kind..  The patient has been doing well on the 1-year-old CPAP of which we could not retrieve any data.     RECOMMENDATION: I would have much preferred to get him into the lab for   titration study in an attended form.  His health Plan will not cover that we are going to order a auto titration CPAP with a setting from 5 through 20 cmH2O to centimeter EPR.  I am awaiting the results of that auto titration device use after 30 to 60 days.  The patient should use the mask he is most comfortable with.  Heated humidification will be part of the machines capability.    INTERPRETING PHYSICIAN: Melvyn Novas, MD   Medical Director of [facility] at [facility].

## 2020-11-09 NOTE — Telephone Encounter (Signed)
-----   Message from Melvyn Novas, MD sent at 11/09/2020 12:03 PM EDT ----- IMPRESSION:  This HST confirms the presence of severe sleep apnea but the much higher non-REM sleep component is suspicious for either a Cheyne-Stokes respiration contribution or central apnea of other kind..  The patient has been doing well on the 30-year-old CPAP of which we could not retrieve any data.    RECOMMENDATION: I would have much preferred to get him into the lab for  titration study in an attended form.  His health Plan will not cover that we are going to order a auto titration CPAP with a setting from 5 through 20 cmH2O to centimeter EPR.  I am awaiting the results of that auto titration device use after 30 to 60 days.  The patient should use the mask he is most comfortable with.  Heated humidification will be part of the machines capability.

## 2020-11-09 NOTE — Addendum Note (Signed)
Addended by: Melvyn Novas on: 11/09/2020 12:26 PM   Modules accepted: Orders

## 2021-02-08 ENCOUNTER — Ambulatory Visit: Payer: 59 | Admitting: Neurology

## 2023-06-01 ENCOUNTER — Ambulatory Visit: Admitting: Family Medicine

## 2023-06-06 ENCOUNTER — Ambulatory Visit: Admitting: Family Medicine
